# Patient Record
Sex: Female | Born: 1935 | Race: White | Hispanic: No | Smoking: Never smoker
Health system: Southern US, Community
[De-identification: ages and names within clinical notes are randomized; demographics above are authoritative.]

## PROBLEM LIST (undated history)

## (undated) DIAGNOSIS — R011 Cardiac murmur, unspecified: Secondary | ICD-10-CM

## (undated) DIAGNOSIS — I10 Essential (primary) hypertension: Secondary | ICD-10-CM

## (undated) DIAGNOSIS — H409 Unspecified glaucoma: Secondary | ICD-10-CM

## (undated) DIAGNOSIS — I4891 Unspecified atrial fibrillation: Secondary | ICD-10-CM

## (undated) DIAGNOSIS — C801 Malignant (primary) neoplasm, unspecified: Secondary | ICD-10-CM

## (undated) DIAGNOSIS — M1990 Unspecified osteoarthritis, unspecified site: Secondary | ICD-10-CM

## (undated) DIAGNOSIS — T7840XA Allergy, unspecified, initial encounter: Secondary | ICD-10-CM

## (undated) DIAGNOSIS — E785 Hyperlipidemia, unspecified: Secondary | ICD-10-CM

## (undated) DIAGNOSIS — G43909 Migraine, unspecified, not intractable, without status migrainosus: Secondary | ICD-10-CM

## (undated) HISTORY — PX: BREAST EXCISIONAL BIOPSY: SUR124

## (undated) HISTORY — DX: Unspecified atrial fibrillation: I48.91

## (undated) HISTORY — DX: Migraine, unspecified, not intractable, without status migrainosus: G43.909

## (undated) HISTORY — DX: Essential (primary) hypertension: I10

## (undated) HISTORY — DX: Malignant (primary) neoplasm, unspecified: C80.1

## (undated) HISTORY — PX: ROBOTIC ASSISTED LAP VAGINAL HYSTERECTOMY: SHX2362

## (undated) HISTORY — DX: Unspecified glaucoma: H40.9

## (undated) HISTORY — DX: Allergy, unspecified, initial encounter: T78.40XA

## (undated) HISTORY — PX: ABDOMINAL HYSTERECTOMY: SHX81

## (undated) HISTORY — DX: Unspecified osteoarthritis, unspecified site: M19.90

## (undated) HISTORY — DX: Cardiac murmur, unspecified: R01.1

## (undated) HISTORY — PX: TONSILLECTOMY AND ADENOIDECTOMY: SHX28

## (undated) HISTORY — DX: Hyperlipidemia, unspecified: E78.5

---

## 1968-05-21 HISTORY — PX: BREAST LUMPECTOMY: SHX2

## 2001-04-11 DIAGNOSIS — I10 Essential (primary) hypertension: Secondary | ICD-10-CM | POA: Insufficient documentation

## 2001-04-11 DIAGNOSIS — I480 Paroxysmal atrial fibrillation: Secondary | ICD-10-CM | POA: Insufficient documentation

## 2010-05-21 DIAGNOSIS — C55 Malignant neoplasm of uterus, part unspecified: Secondary | ICD-10-CM

## 2010-05-21 HISTORY — DX: Malignant neoplasm of uterus, part unspecified: C55

## 2010-09-20 DIAGNOSIS — N8502 Endometrial intraepithelial neoplasia [EIN]: Secondary | ICD-10-CM | POA: Insufficient documentation

## 2010-09-30 DIAGNOSIS — C541 Malignant neoplasm of endometrium: Secondary | ICD-10-CM | POA: Insufficient documentation

## 2012-11-19 DIAGNOSIS — E785 Hyperlipidemia, unspecified: Secondary | ICD-10-CM | POA: Insufficient documentation

## 2012-11-19 DIAGNOSIS — G43909 Migraine, unspecified, not intractable, without status migrainosus: Secondary | ICD-10-CM

## 2012-11-19 HISTORY — DX: Migraine, unspecified, not intractable, without status migrainosus: G43.909

## 2016-02-14 DIAGNOSIS — G8929 Other chronic pain: Secondary | ICD-10-CM | POA: Insufficient documentation

## 2021-05-04 ENCOUNTER — Ambulatory Visit (INDEPENDENT_AMBULATORY_CARE_PROVIDER_SITE_OTHER): Payer: Medicare PPO | Admitting: Adult Health

## 2021-05-04 ENCOUNTER — Encounter: Payer: Self-pay | Admitting: Adult Health

## 2021-05-04 ENCOUNTER — Other Ambulatory Visit: Payer: Self-pay

## 2021-05-04 VITALS — BP 138/66 | HR 61 | Wt 131.0 lb

## 2021-05-04 DIAGNOSIS — G8929 Other chronic pain: Secondary | ICD-10-CM

## 2021-05-04 DIAGNOSIS — M545 Low back pain, unspecified: Secondary | ICD-10-CM

## 2021-05-04 DIAGNOSIS — Z8542 Personal history of malignant neoplasm of other parts of uterus: Secondary | ICD-10-CM

## 2021-05-04 DIAGNOSIS — Z8669 Personal history of other diseases of the nervous system and sense organs: Secondary | ICD-10-CM

## 2021-05-04 DIAGNOSIS — M1990 Unspecified osteoarthritis, unspecified site: Secondary | ICD-10-CM

## 2021-05-04 DIAGNOSIS — F5101 Primary insomnia: Secondary | ICD-10-CM | POA: Diagnosis not present

## 2021-05-04 DIAGNOSIS — I10 Essential (primary) hypertension: Secondary | ICD-10-CM

## 2021-05-04 DIAGNOSIS — E559 Vitamin D deficiency, unspecified: Secondary | ICD-10-CM

## 2021-05-04 DIAGNOSIS — I480 Paroxysmal atrial fibrillation: Secondary | ICD-10-CM | POA: Diagnosis not present

## 2021-05-04 DIAGNOSIS — M25512 Pain in left shoulder: Secondary | ICD-10-CM

## 2021-05-04 DIAGNOSIS — D509 Iron deficiency anemia, unspecified: Secondary | ICD-10-CM

## 2021-05-04 NOTE — Patient Instructions (Signed)
Anemia Anemia is a condition in which there is not enough red blood cells or hemoglobin in the blood. Hemoglobin is a substance in red blood cells that carries oxygen. When you do not have enough red blood cells or hemoglobin (are anemic), your body cannot get enough oxygen and your organs may not work properly. As a result, you may feel very tired or have other problems. What are the causes? Common causes of anemia include: Excessive bleeding. Anemia can be caused by excessive bleeding inside or outside the body, including bleeding from the intestines or from heavy menstrual periods in females. Poor nutrition. Long-lasting (chronic) kidney, thyroid, and liver disease. Bone marrow disorders, spleen problems, and blood disorders. Cancer and treatments for cancer. HIV (human immunodeficiency virus) and AIDS (acquired immunodeficiency syndrome). Infections, medicines, and autoimmune disorders that destroy red blood cells. What are the signs or symptoms? Symptoms of this condition include: Minor weakness. Dizziness. Headache, or difficulties concentrating and sleeping. Heartbeats that feel irregular or faster than normal (palpitations). Shortness of breath, especially with exercise. Pale skin, lips, and nails, or cold hands and feet. Indigestion and nausea. Symptoms may occur suddenly or develop slowly. If your anemia is mild, you may not have symptoms. How is this diagnosed? This condition is diagnosed based on blood tests, your medical history, and a physical exam. In some cases, a test may be needed in which cells are removed from the soft tissue inside of a bone and looked at under a microscope (bone marrow biopsy). Your health care provider may also check your stool (feces) for blood and may do additional testing to look for the cause of your bleeding. Other tests may include: Imaging tests, such as a CT scan or MRI. A procedure to see inside your esophagus and stomach (endoscopy). A  procedure to see inside your colon and rectum (colonoscopy). How is this treated? Treatment for this condition depends on the cause. If you continue to lose a lot of blood, you may need to be treated at a hospital. Treatment may include: Taking supplements of iron, vitamin U54, or folic acid. Taking a hormone medicine (erythropoietin) that can help to stimulate red blood cell growth. Having a blood transfusion. This may be needed if you lose a lot of blood. Making changes to your diet. Having surgery to remove your spleen. Follow these instructions at home: Take over-the-counter and prescription medicines only as told by your health care provider. Take supplements only as told by your health care provider. Follow any diet instructions that you were given by your health care provider. Keep all follow-up visits as told by your health care provider. This is important. Contact a health care provider if: You develop new bleeding anywhere in the body. Get help right away if: You are very weak. You are short of breath. You have pain in your abdomen or chest. You are dizzy or feel faint. You have trouble concentrating. You have bloody stools, black stools, or tarry stools. You vomit repeatedly or you vomit up blood. These symptoms may represent a serious problem that is an emergency. Do not wait to see if the symptoms will go away. Get medical help right away. Call your local emergency services (911 in the [place] Do not drive yourself to the hospital. Summary Anemia is a condition in which you do not have enough red blood cells or enough of a substance in your red blood cells that carries oxygen (hemoglobin). Symptoms may occur suddenly or develop slowly. If your anemia is  mild, you may not have symptoms. °This condition is diagnosed with blood tests, a medical history, and a physical exam. Other tests may be needed. °Treatment for this condition depends on the cause of the anemia. °This  information is not intended to replace advice given to you by your health care provider. Make sure you discuss any questions you have with your health care provider. °Document Revised: 04/14/2019 Document Reviewed: 04/14/2019 °Elsevier Patient Education © 2022 Elsevier Inc. °Health Maintenance, Female °Adopting a healthy lifestyle and getting preventive care are important in promoting health and wellness. Ask your health care provider about: °The right schedule for you to have regular tests and exams. °Things you can do on your own to prevent diseases and keep yourself healthy. °What should I know about diet, weight, and exercise? °Eat a healthy diet ° °Eat a diet that includes plenty of vegetables, fruits, low-fat dairy products, and lean protein. °Do not eat a lot of foods that are high in solid fats, added sugars, or sodium. °Maintain a healthy weight °Body mass index (BMI) is used to identify weight problems. It estimates body fat based on height and weight. Your health care provider can help determine your BMI and help you achieve or maintain a healthy weight. °Get regular exercise °Get regular exercise. This is one of the most important things you can do for your health. Most adults should: °Exercise for at least 150 minutes each week. The exercise should increase your heart rate and make you sweat (moderate-intensity exercise). °Do strengthening exercises at least twice a week. This is in addition to the moderate-intensity exercise. °Spend less time sitting. Even light physical activity can be beneficial. °Watch cholesterol and blood lipids °Have your blood tested for lipids and cholesterol at 85 years of age, then have this test every 5 years. °Have your cholesterol levels checked more often if: °Your lipid or cholesterol levels are high. °You are older than 85 years of age. °You are at high risk for heart disease. °What should I know about cancer screening? °Depending on your health history and family  history, you may need to have cancer screening at various ages. This may include screening for: °Breast cancer. °Cervical cancer. °Colorectal cancer. °Skin cancer. °Lung cancer. °What should I know about heart disease, diabetes, and high blood pressure? °Blood pressure and heart disease °High blood pressure causes heart disease and increases the risk of stroke. This is more likely to develop in people who have high blood pressure readings or are overweight. °Have your blood pressure checked: °Every 3-5 years if you are 18-39 years of age. °Every year if you are 40 years old or older. °Diabetes °Have regular diabetes screenings. This checks your fasting blood sugar level. Have the screening done: °Once every three years after age 40 if you are at a normal weight and have a low risk for diabetes. °More often and at a younger age if you are overweight or have a high risk for diabetes. °What should I know about preventing infection? °Hepatitis B °If you have a higher risk for hepatitis B, you should be screened for this virus. Talk with your health care provider to find out if you are at risk for hepatitis B infection. °Hepatitis C °Testing is recommended for: °Everyone born from 1945 through 1965. °Anyone with known risk factors for hepatitis C. °Sexually transmitted infections (STIs) °Get screened for STIs, including gonorrhea and chlamydia, if: °You are sexually active and are younger than 85 years of age. °You are older   than 85 years of age and your health care provider tells you that you are at risk for this type of infection. Your sexual activity has changed since you were last screened, and you are at increased risk for chlamydia or gonorrhea. Ask your health care provider if you are at risk. Ask your health care provider about whether you are at high risk for HIV. Your health care provider may recommend a prescription medicine to help prevent HIV infection. If you choose to take medicine to prevent HIV, you  should first get tested for HIV. You should then be tested every 3 months for as long as you are taking the medicine. Pregnancy If you are about to stop having your period (premenopausal) and you may become pregnant, seek counseling before you get pregnant. Take 400 to 800 micrograms (mcg) of folic acid every day if you become pregnant. Ask for birth control (contraception) if you want to prevent pregnancy. Osteoporosis and menopause Osteoporosis is a disease in which the bones lose minerals and strength with aging. This can result in bone fractures. If you are 75 years old or older, or if you are at risk for osteoporosis and fractures, ask your health care provider if you should: Be screened for bone loss. Take a calcium or vitamin D supplement to lower your risk of fractures. Be given hormone replacement therapy (HRT) to treat symptoms of menopause. Follow these instructions at home: Alcohol use Do not drink alcohol if: Your health care provider tells you not to drink. You are pregnant, may be pregnant, or are planning to become pregnant. If you drink alcohol: Limit how much you have to: 0-1 drink a day. Know how much alcohol is in your drink. In the [place] one drink equals one 12 oz bottle of beer (355 mL), one 5 oz glass of wine (148 mL), or one 1 oz glass of hard liquor (44 mL). Lifestyle Do not use any products that contain nicotine or tobacco. These products include cigarettes, chewing tobacco, and vaping devices, such as e-cigarettes. If you need help quitting, ask your health care provider. Do not use street drugs. Do not share needles. Ask your health care provider for help if you need support or information about quitting drugs. General instructions Schedule regular health, dental, and eye exams. Stay current with your vaccines. Tell your health care provider if: You often feel depressed. You have ever been abused or do not feel safe at home. Summary Adopting a healthy  lifestyle and getting preventive care are important in promoting health and wellness. Follow your health care provider's instructions about healthy diet, exercising, and getting tested or screened for diseases. Follow your health care provider's instructions on monitoring your cholesterol and blood pressure. This information is not intended to replace advice given to you by your health care provider. Make sure you discuss any questions you have with your health care provider. Document Revised: 09/26/2020 Document Reviewed: 09/26/2020 Elsevier Patient Education  [place].

## 2021-05-04 NOTE — Progress Notes (Signed)
New Patient Office Visit  Subjective:  Patient ID: Janice Bush, female    DOB: 07/03/1931  Age: 87 y.o. MRN: [id number]  CC:  Chief Complaint  Patient presents with   New Patient (Initial Visit)    HPI Janice Bush presents for new patient care, she has been seeing [facility] Dr. Redmond Pulling, for healthcare but just moved to village of [place] from [facility].   She reports she is feeling fine today.  She was diagnosed with anemia.  She was started on Iron 03/25/21.  History of glaucoma   Seen before at spine center at [facility] in past for chronic scoliosis and sciatica.   Patient  denies any fever, body aches,chills, rash, chest pain, shortness of breath, nausea, vomiting, or diarrhea.  Denies dizziness, lightheadedness, pre syncopal or syncopal episodes.     Past Medical History:  Diagnosis Date   Afib ([place])    Allergy    Arthritis    Cancer ([place])    Glaucoma    Heart murmur    Hyperlipidemia    Hypertension    Migraines     Past Surgical History:  Procedure Laterality Date   ABDOMINAL HYSTERECTOMY     TONSILLECTOMY AND ADENOIDECTOMY      Family History  Problem Relation Age of Onset   Hypertension Mother    Early death Mother    Cancer Mother    Early death Father    Alcohol abuse Father    Heart attack Father    Hypertension Brother    Hyperlipidemia Brother    Heart attack Brother    Early death Brother    Diabetes Brother    Heart attack Brother    Early death Brother     Social History   Socioeconomic History   Marital status: Widowed    Spouse name: Not on file   Number of children: Not on file   Years of education: Not on file   Highest education level: Not on file  Occupational History   Not on file  Tobacco Use   Smoking status: Never   Smokeless tobacco: Never  Vaping Use   Vaping Use: Never used  Substance and Sexual Activity   Alcohol use: Never   Drug use: Never   Sexual activity: Not Currently  Other Topics Concern   Not  on file  Social History Narrative   Not on file   Social Determinants of Health   Financial Resource Strain: Not on file  Food Insecurity: Not on file  Transportation Needs: Not on file  Physical Activity: Not on file  Stress: Not on file  Social Connections: Not on file  Intimate Partner Violence: Not on file    ROS Review of Systems  Objective:   Today's Vitals: BP 138/66    Pulse 61    Wt 131 lb (59.4 kg)    SpO2 95%   Physical Exam Vitals reviewed.  Constitutional:      General: She is not in acute distress.    Appearance: Normal appearance. She is well-developed. She is not ill-appearing, toxic-appearing or diaphoretic.     Interventions: She is not intubated. HENT:     Head: Normocephalic and atraumatic.     Right Ear: External ear normal. There is no impacted cerumen.     Left Ear: External ear normal. There is no impacted cerumen.     Nose: Nose normal.     Mouth/Throat:     Pharynx: No oropharyngeal exudate.  Eyes:  General: Lids are normal. No scleral icterus.       Right eye: No discharge.        Left eye: No discharge.     Extraocular Movements: Extraocular movements intact.     Conjunctiva/sclera: Conjunctivae normal.     Right eye: Right conjunctiva is not injected. No exudate or hemorrhage.    Left eye: Left conjunctiva is not injected. No exudate or hemorrhage.    Pupils: Pupils are equal, round, and reactive to light.  Neck:     Thyroid: No thyroid mass or thyromegaly.     Vascular: Normal carotid pulses. No carotid bruit, hepatojugular reflux or JVD.     Trachea: Trachea and phonation normal. No tracheal tenderness or tracheal deviation.     Meningeal: Brudzinski's sign and Kernig's sign absent.  Cardiovascular:     Rate and Rhythm: Normal rate and regular rhythm.     Pulses: Normal pulses.          Radial pulses are 2+ on the right side and 2+ on the left side.       Dorsalis pedis pulses are 2+ on the right side and 2+ on the left side.        Posterior tibial pulses are 2+ on the right side and 2+ on the left side.     Heart sounds: Normal heart sounds, S1 normal and S2 normal. Heart sounds not distant. No murmur heard.   No friction rub. No gallop.  Pulmonary:     Effort: Pulmonary effort is normal. No tachypnea, bradypnea, accessory muscle usage or respiratory distress. She is not intubated.     Breath sounds: Normal breath sounds. No stridor. No wheezing or rales.  Chest:     Chest wall: No tenderness.  Abdominal:     General: Bowel sounds are normal. There is no distension or abdominal bruit.     Palpations: Abdomen is soft. There is no shifting dullness, fluid wave, hepatomegaly, splenomegaly, mass or pulsatile mass.     Tenderness: There is no abdominal tenderness. There is no guarding or rebound.     Hernia: No hernia is present.  Musculoskeletal:        General: No tenderness or deformity. Normal range of motion.     Cervical back: Full passive range of motion without pain, normal range of motion and neck supple. No edema, erythema or rigidity. No spinous process tenderness or muscular tenderness. Normal range of motion.     Right lower leg: No edema.     Left lower leg: No edema.  Lymphadenopathy:     Head:     Right side of head: No submental, submandibular, tonsillar, preauricular, posterior auricular or occipital adenopathy.     Left side of head: No submental, submandibular, tonsillar, preauricular, posterior auricular or occipital adenopathy.     Cervical: No cervical adenopathy.     Right cervical: No superficial, deep or posterior cervical adenopathy.    Left cervical: No superficial, deep or posterior cervical adenopathy.     Upper Body:     Right upper body: No supraclavicular or pectoral adenopathy.     Left upper body: No supraclavicular or pectoral adenopathy.  Skin:    General: Skin is warm and dry.     Coloration: Skin is not pale.     Findings: No abrasion, bruising, burn, ecchymosis, erythema,  lesion, petechiae or rash.     Nails: There is no clubbing.  Neurological:     General: No focal deficit present.  Mental Status: She is alert and oriented to person, place, and time.     GCS: GCS eye subscore is 4. GCS verbal subscore is 5. GCS motor subscore is 6.     Cranial Nerves: No cranial nerve deficit.     Sensory: No sensory deficit.     Motor: No weakness, tremor, atrophy, abnormal muscle tone or seizure activity.     Coordination: Coordination normal.     Gait: Gait normal.     Deep Tendon Reflexes: Reflexes are normal and symmetric. Reflexes normal. Babinski sign absent on the right side. Babinski sign absent on the left side.     Reflex Scores:      Tricep reflexes are 2+ on the right side and 2+ on the left side.      Bicep reflexes are 2+ on the right side and 2+ on the left side.      Brachioradialis reflexes are 2+ on the right side and 2+ on the left side.      Patellar reflexes are 2+ on the right side and 2+ on the left side.      Achilles reflexes are 2+ on the right side and 2+ on the left side. Psychiatric:        Mood and Affect: Mood normal.        Speech: Speech normal.        Behavior: Behavior normal.        Thought Content: Thought content normal.        Judgment: Judgment normal.    Assessment & Plan:   Problem List Items Addressed This Visit       Cardiovascular and Mediastinum   Essential hypertension   Relevant Medications   apixaban (ELIQUIS) 5 MG TABS tablet   amLODipine (NORVASC) 5 MG tablet   losartan (COZAAR) 50 MG tablet   digoxin (LANOXIN) 0.25 MG tablet   Other Relevant Orders   Ambulatory referral to Cardiology   Lipid panel   TSH   AMB Referral to [facility]   Paroxysmal atrial fibrillation (HCC) - Primary   Relevant Medications   apixaban (ELIQUIS) 5 MG TABS tablet   amLODipine (NORVASC) 5 MG tablet   losartan (COZAAR) 50 MG tablet   digoxin (LANOXIN) 0.25 MG tablet   Other Relevant Orders   Ambulatory  referral to Cardiology   AMB Referral to [place]     Other   Chronic lumbosacral pain   Relevant Medications   gabapentin (NEURONTIN) 300 MG capsule   gabapentin (NEURONTIN) 100 MG capsule   Other Relevant Orders   AMB Referral to [place]   Other Visit Diagnoses     Osteoarthritis, unspecified osteoarthritis type, unspecified site       Relevant Orders   AMB Referral to [facility]ordin[facility]   Primary insomnia       Relevant Orders   AMB Referral to [facility] Coordin[facility]   History of endometrial cancer       Relevant Orders   AMB Referral to [facility]ordin[facility]   Chronic left shoulder pain       Relevant Medications   gabapentin (NEURONTIN) 300 MG capsule   gabapentin (NEURONTIN) 100 MG capsule   Other Relevant Orders   AMB Referral to [facility] Coordin[facility]   History of migraine       Relevant Orders   AMB Referral to [facility] Coordin[facility]   Iron deficiency anemia, unspecified iron deficiency anemia type  Relevant Orders   CBC with Differential/Platelet   Comprehensive metabolic panel   Iron, TIBC and Ferritin Panel   B12   Aerobic culture   AMB Referral to [place]   Vitamin D deficiency       Relevant Orders   VITAMIN D 25 Hydroxy (Vit-D Deficiency, Fractures)   AMB Referral to [facility]       Outpatient Encounter Medications as of 05/04/2021  Medication Sig   amLODipine (NORVASC) 5 MG tablet Take 1 tablet by mouth daily.   apixaban (ELIQUIS) 5 MG TABS tablet Take 1 tablet by mouth 2 (two) times daily.   digoxin (LANOXIN) 0.25 MG tablet Take by mouth.   estradiol (ESTRACE) 0.1 MG/GM vaginal cream Place vaginally.   gabapentin (NEURONTIN) 100 MG capsule Take by mouth.   gabapentin (NEURONTIN) 300 MG capsule Take by mouth.   LORazepam (ATIVAN) 0.5 MG tablet Take by mouth.   timolol (TIMOPTIC-XR) 0.5 % ophthalmic gel-forming Administer 1 drop to both eyes  Take as directed.   losartan (COZAAR) 50 MG tablet Take by mouth.   No facility-administered encounter medications on file as of 05/04/2021.   Orders Placed This Encounter  Procedures   Aerobic culture    Standing Status:   Future    Standing Expiration Date:   05/04/2022    Order Specific Question:   Source    Answer:   rectal   CBC with Differential/Platelet    Standing Status:   Future    Standing Expiration Date:   05/04/2022   Comprehensive metabolic panel    Standing Status:   Future    Standing Expiration Date:   05/04/2022   VITAMIN D 25 Hydroxy (Vit-D Deficiency, Fractures)    Standing Status:   Future    Standing Expiration Date:   05/04/2022   Lipid panel    Standing Status:   Future    Standing Expiration Date:   05/04/2022   TSH    Standing Status:   Future    Standing Expiration Date:   05/04/2022   Iron, TIBC and Ferritin Panel    Standing Status:   Future    Standing Expiration Date:   05/04/2022   B12    Standing Status:   Future    Standing Expiration Date:   05/04/2022   Ambulatory referral to Cardiology    Referral Priority:   Routine    Referral Type:   Consultation    Referral Reason:   Specialty Services Required    Requested Specialty:   Cardiology    Number of Visits Requested:   1   AMB Referral to [facility] Coordin[facility]    Referral Priority:   Routine    Referral Type:   Consultation    Referral Reason:   Care Coordination    Number of Visits Requested:   1   Fasting labs followed by CPE advised. Follow up in 3 months on chronic disease. Referral to cardiology for chronic Atrial fibrillation. Should hear within 3 weeks and if not give our office a call is advised.  Return in about 3 months (around 08/02/2021), or if symptoms worsen or fail to improve, for at any time for any worsening symptoms, Go to Emergency room/ urgent care if worse.   Follow-up: Return in about 3 months (around 08/02/2021), or if symptoms worsen or fail to improve, for  at any time for any worsening symptoms, Go to Emergency room/ urgent care if worse.   [place]  Harold Mattes, FNP

## 2021-05-05 ENCOUNTER — Encounter: Payer: Self-pay | Admitting: Adult Health

## 2021-05-07 ENCOUNTER — Encounter: Payer: Self-pay | Admitting: Adult Health

## 2021-05-08 ENCOUNTER — Telehealth: Payer: Self-pay

## 2021-05-08 NOTE — Telephone Encounter (Signed)
Noted. She was in agreement in office. Thank you

## 2021-05-08 NOTE — Chronic Care Management (AMB) (Signed)
Chronic Care Management   Note  05/08/2021 Name: Kylah Maresh MRN: [id number] DOB: 12-Jun-1931  Annaliza Zia is a 87 y.o. year old female who is a primary care patient of Flinchum, Kelby Aline, FNP. I reached out to Allena Earing by phone today in response to a referral sent by Ms. Pamala Hurry Donnan's PCP.  Ms. Dolinger was given information about Chronic Care Management services today including:  CCM service includes personalized support from designated clinical staff supervised by her physician, including individualized plan of care and coordination with other care providers 24/7 contact phone numbers for assistance for urgent and routine care needs. Service will only be billed when office clinical staff spend 20 minutes or more in a month to coordinate care. Only one practitioner may furnish and bill the service in a calendar month. The patient may stop CCM services at any time (effective at the end of the month) by phone call to the office staff. The patient is responsible for co-pay (up to 20% after annual deductible is met) if co-pay is required by the individual health plan.   Patient did not agree to enrollment in care management services and does not wish to consider at this time.  Follow up plan: Patient declines engagement by the care management team. Appropriate care team members and provider have been notified via electronic communication.   Noreene Larsson, [place], [place], [place] [address] Direct Dial: [phone] [email or web address]: [email or web address]

## 2021-05-30 ENCOUNTER — Encounter: Payer: Self-pay | Admitting: Cardiovascular Disease

## 2021-05-30 ENCOUNTER — Other Ambulatory Visit: Payer: Self-pay

## 2021-05-30 ENCOUNTER — Ambulatory Visit: Payer: Medicare PPO | Admitting: Cardiovascular Disease

## 2021-05-30 VITALS — BP 118/78 | HR 54 | Ht 65.0 in | Wt 130.1 lb

## 2021-05-30 DIAGNOSIS — E782 Mixed hyperlipidemia: Secondary | ICD-10-CM

## 2021-05-30 DIAGNOSIS — I480 Paroxysmal atrial fibrillation: Secondary | ICD-10-CM | POA: Diagnosis not present

## 2021-05-30 DIAGNOSIS — I10 Essential (primary) hypertension: Secondary | ICD-10-CM | POA: Diagnosis not present

## 2021-05-30 MED ORDER — DIGOXIN 250 MCG PO TABS: 0.1250 mg | ORAL_TABLET | Freq: Every day | ORAL | 3 refills | Status: DC

## 2021-05-30 MED ORDER — APIXABAN 2.5 MG PO TABS: 2.5000 mg | ORAL_TABLET | Freq: Two times a day (BID) | ORAL | 11 refills | Status: DC

## 2021-05-30 NOTE — Progress Notes (Signed)
Cardiology Office Note  Date:  05/30/2021   ID:  Janice Bush, DOB 01-20-1932, MRN [id number]  PCP:  Doreen Beam, FNP   Chief Complaint  Patient presents with   New Patient (Initial Visit)    Ref by Laverna Peace, FNP to establish care for A-Fib. Medications reviewed by the patient verbally.     HPI:  Janice Bush is a 88 year old woman with past medical history of Anemia, on iron Scoliosis/sciatica Atrial fibrillation, likely permanent Hypertension, hyperlipidemia Who presents by referral from Laverna Peace for consultation of her atrial fibrillation  Notes from [facility] geriatric office reviewed On Eliquis reduced dose, digoxin  previously on metoprolol  EKG at [facility] March 30, 2021 showing atrial fibrillation rate 82 bpm left bundle branch block  Echocardiogram 2017 ejection fraction greater than 55% normal left atrial size  Digoxin level 1.1 in November 2022 Total cholesterol 209 LDL 122  Ill last week, went to urgent care "[facility]"  Prior PMD stopped metoprolol, unclear reasons She feels it was because she was on too many medications   EKG personally reviewed by myself on todays visit Atrial fib rate 54 bpm   PMH:   has a past medical history of Afib ([place]), Allergy, Arthritis, Cancer ([place]), Glaucoma, Heart murmur, Hyperlipidemia, Hypertension, and Migraines.  PSH:    Past Surgical History:  Procedure Laterality Date   ABDOMINAL HYSTERECTOMY     TONSILLECTOMY AND ADENOIDECTOMY      Current Outpatient Medications  Medication Sig Dispense Refill   amLODipine (NORVASC) 5 MG tablet Take 1 tablet by mouth daily.     ascorbic acid (VITAMIN C) 500 MG tablet Take by mouth.     Cholecalciferol (VITAMIN D3 PO) Take by mouth.     Cyanocobalamin (VITAMIN B 12) 100 MCG LOZG Take by mouth daily.     digoxin (LANOXIN) 0.25 MG tablet Take by mouth.     estradiol (ESTRACE) 0.1 MG/GM vaginal cream Place vaginally.     ferrous sulfate 324 MG TBEC  Take 324 mg by mouth.     gabapentin (NEURONTIN) 100 MG capsule Take by mouth.     gabapentin (NEURONTIN) 300 MG capsule Take by mouth.     hydrocortisone 2.5 % ointment Apply 1 application TWICE DAILY.     LORazepam (ATIVAN) 0.5 MG tablet Take by mouth.     timolol (TIMOPTIC-XR) 0.5 % ophthalmic gel-forming Administer 1 drop to both eyes Take as directed.     apixaban (ELIQUIS) 2.5 MG TABS tablet Take 1 tablet (2.5 mg total) by mouth 2 (two) times daily.     losartan (COZAAR) 25 MG tablet Take 1 tablet (25 mg total) by mouth in the morning and at bedtime.     No current facility-administered medications for this visit.     Allergies:   Tramadol and Diphenhydramine hcl   Social History:  The patient  reports that she has never smoked. She has never used smokeless tobacco. She reports that she does not drink alcohol and does not use drugs.   Family History:   family history includes Alcohol abuse in her father; Cancer in her mother; Diabetes in her brother; Early death in her brother, brother, father, and mother; Heart attack in her brother, brother, and father; Hyperlipidemia in her brother; Hypertension in her brother and mother.    Review of Systems: Review of Systems  Constitutional: Negative.   HENT: Negative.    Respiratory: Negative.    Cardiovascular: Negative.   Gastrointestinal: Negative.   Musculoskeletal: Negative.  Neurological: Negative.   Psychiatric/Behavioral: Negative.    All other systems reviewed and are negative.   PHYSICAL EXAM: VS:  BP 118/78 (BP Location: Right Arm, Patient Position: Sitting, Cuff Size: Normal)    Pulse (!) 54    Ht 5\' 5"  (1.651 m)    Wt 130 lb 2 oz (59 kg)    SpO2 98%    BMI 21.65 kg/m  , BMI Body mass index is 21.65 kg/m. GEN: Well nourished, well developed, in no acute distress HEENT: normal Neck: no JVD, carotid bruits, or masses Cardiac: RRR; no murmurs, rubs, or gallops,no edema  Respiratory:  clear to auscultation bilaterally,  normal work of breathing GI: soft, nontender, nondistended, + BS MS: no deformity or atrophy Skin: warm and dry, no rash Neuro:  Strength and sensation are intact Psych: euthymic mood, full affect  Recent Labs: No results found for requested labs within last 8760 hours.    Lipid Panel No results found for: CHOL, HDL, LDLCALC, TRIG    Wt Readings from Last 3 Encounters:  05/30/21 130 lb 2 oz (59 kg)  05/04/21 131 lb (59.4 kg)       ASSESSMENT AND PLAN:  Problem List Items Addressed This Visit       Cardiology Problems   Hyperlipidemia   Relevant Medications   losartan (COZAAR) 25 MG tablet   apixaban (ELIQUIS) 2.5 MG TABS tablet   Essential hypertension   Relevant Medications   losartan (COZAAR) 25 MG tablet   apixaban (ELIQUIS) 2.5 MG TABS tablet   Other Relevant Orders   EKG 12-Lead   Paroxysmal atrial fibrillation (HCC) - Primary   Relevant Medications   losartan (COZAAR) 25 MG tablet   apixaban (ELIQUIS) 2.5 MG TABS tablet   Other Relevant Orders   EKG 12-Lead   Permanent atrial fibrillation On Eliquis, Metoprolol previously held by outpatient primary care at [facility] she decrease digoxin in half, down to 0.125 mg daily given bradycardia Compliance with Eliquis recommended to prevent TIA and stroke symptoms  PVCs Noted on EKG, asymptomatic Monitor for now  Essential hypertension Blood pressure is well controlled on today's visit. No changes made to the medications.    Total encounter time more than 60 minutes  Greater than 50% was spent in counseling and coordination of care with the patient    Signed, Esmond Plants, M.D., Ph.D. [place], [place]

## 2021-05-30 NOTE — Patient Instructions (Addendum)
Medication Instructions:  Please REDUCE Digoxin in 1/2 daily (0.125 daily) Eliquis 2.5 mg twice a day    If you need a refill on your cardiac medications before your next appointment, please call your pharmacy.   Lab work: No new labs needed  Testing/Procedures: No new testing needed  Follow-Up: At [facility], you and your health needs are our priority.  As part of our continuing mission to provide you with exceptional heart care, we have created designated Provider Care Teams.  These Care Teams include your primary Cardiologist (physician) and Advanced Practice Providers (APPs -  Physician Assistants and Nurse Practitioners) who all work together to provide you with the care you need, when you need it.  You will need a follow up appointment in 6 months, APP ok  Providers on your designated Care Team:   Murray Hodgkins, NP Christell Faith, PA-C Cadence Kathlen Mody, [place]  COVID-19 Vaccine Information can be found at: ShippingScam.co.uk For questions related to vaccine distribution or appointments, please email [email or web address] .[email or web address] or call [phone].

## 2021-05-31 ENCOUNTER — Other Ambulatory Visit: Payer: Self-pay | Admitting: Adult Health

## 2021-06-23 ENCOUNTER — Telehealth: Payer: Self-pay | Admitting: Adult Health

## 2021-06-23 NOTE — Telephone Encounter (Signed)
Pt called in stating that she have a lab appt on 08/01/2021 at 9:15am. Pt was wondering if it would be ok for her to do lab orders at the [facility] and have the lab result sent over to our office. Pt stated that she thinks that would be easier. Pt was wondering if she should just come to the office to get them dobe were the lab result will be back by the time of Pt physical appt.  Pt requesting callback

## 2021-06-28 ENCOUNTER — Other Ambulatory Visit: Payer: Self-pay

## 2021-06-28 ENCOUNTER — Encounter: Payer: Self-pay | Admitting: Adult Health

## 2021-07-10 ENCOUNTER — Other Ambulatory Visit: Payer: Self-pay

## 2021-07-10 MED ORDER — DIGOXIN 125 MCG PO TABS: 0.1250 mg | ORAL_TABLET | Freq: Every day | ORAL | 1 refills | Status: DC

## 2021-07-11 ENCOUNTER — Other Ambulatory Visit: Payer: Self-pay

## 2021-08-01 ENCOUNTER — Other Ambulatory Visit: Payer: Medicare PPO

## 2021-08-03 ENCOUNTER — Ambulatory Visit (INDEPENDENT_AMBULATORY_CARE_PROVIDER_SITE_OTHER): Payer: Medicare PPO | Admitting: Adult Health

## 2021-08-03 ENCOUNTER — Other Ambulatory Visit: Payer: Self-pay

## 2021-08-03 ENCOUNTER — Encounter: Payer: Self-pay | Admitting: Adult Health

## 2021-08-03 ENCOUNTER — Other Ambulatory Visit (HOSPITAL_COMMUNITY)
Admission: RE | Admit: 2021-08-03 | Discharge: 2021-08-03 | Disposition: A | Discharge status: Home / Self Care | Payer: Medicare PPO | Source: Ambulatory Visit | Attending: Adult Health | Admitting: Adult Health

## 2021-08-03 VITALS — BP 136/60 | HR 85 | Temp 98.0°F | Ht 65.0 in | Wt 131.0 lb

## 2021-08-03 DIAGNOSIS — Z1231 Encounter for screening mammogram for malignant neoplasm of breast: Secondary | ICD-10-CM

## 2021-08-03 DIAGNOSIS — C541 Malignant neoplasm of endometrium: Secondary | ICD-10-CM

## 2021-08-03 DIAGNOSIS — N898 Other specified noninflammatory disorders of vagina: Secondary | ICD-10-CM | POA: Diagnosis present

## 2021-08-03 DIAGNOSIS — I480 Paroxysmal atrial fibrillation: Secondary | ICD-10-CM

## 2021-08-03 DIAGNOSIS — Z0000 Encounter for general adult medical examination without abnormal findings: Secondary | ICD-10-CM | POA: Insufficient documentation

## 2021-08-03 DIAGNOSIS — Z90710 Acquired absence of both cervix and uterus: Secondary | ICD-10-CM | POA: Diagnosis not present

## 2021-08-03 MED ORDER — NYSTATIN 100000 UNIT/GM EX CREA: 1.0000 "application " | TOPICAL_CREAM | Freq: Two times a day (BID) | CUTANEOUS | 0 refills | Status: DC

## 2021-08-03 NOTE — Patient Instructions (Addendum)
Therapist, music at [facility] ?Address: [address] [place], [place] [address] ?Phone: [phone] ?Eugenia Pancoast FNP ? ? ?Call to schedule your screening mammogram. Your orders have been placed for your exam.  ?Let our office know if you have questions, concerns, or any difficulty scheduling.  ?If normal results then yearly screening mammograms are recommended unless you notice  ?Changes in your breast then you should schedule a follow up office visit. If abnormal results  ?Further imaging will be warranted and sooner follow up as determined by the radiologist at the [facility].  ? ?[facility] ?[place], [place] [address]  ?Main: [phone]   ? ? ?Health Maintenance, Female ?Adopting a healthy lifestyle and getting preventive care are important in promoting health and wellness. Ask your health care provider about: ?The right schedule for you to have regular tests and exams. ?Things you can do on your own to prevent diseases and keep yourself healthy. ?What should I know about diet, weight, and exercise? ?Eat a healthy diet ? ?Eat a diet that includes plenty of vegetables, fruits, low-fat dairy products, and lean protein. ?Do not eat a lot of foods that are high in solid fats, added sugars, or sodium. ?Maintain a healthy weight ?Body mass index (BMI) is used to identify weight problems. It estimates body fat based on height and weight. Your health care provider can help determine your BMI and help you achieve or maintain a healthy weight. ?Get regular exercise ?Get regular exercise. This is one of the most important things you can do for your health. Most adults should: ?Exercise for at least 150 minutes each week. The exercise should increase your heart rate and make you sweat (moderate-intensity exercise). ?Do strengthening exercises at least twice a week. This is in addition to the moderate-intensity exercise. ?Spend less time sitting. Even light  physical activity can be beneficial. ?Watch cholesterol and blood lipids ?Have your blood tested for lipids and cholesterol at 86 years of age, then have this test every 5 years. ?Have your cholesterol levels checked more often if: ?Your lipid or cholesterol levels are high. ?You are older than 86 years of age. ?You are at high risk for heart disease. ?What should I know about cancer screening? ?Depending on your health history and family history, you may need to have cancer screening at various ages. This may include screening for: ?Breast cancer. ?Cervical cancer. ?Colorectal cancer. ?Skin cancer. ?Lung cancer. ?What should I know about heart disease, diabetes, and high blood pressure? ?Blood pressure and heart disease ?High blood pressure causes heart disease and increases the risk of stroke. This is more likely to develop in people who have high blood pressure readings or are overweight. ?Have your blood pressure checked: ?Every 3-5 years if you are 62-37 years of age. ?Every year if you are 41 years old or older. ?Diabetes ?Have regular diabetes screenings. This checks your fasting blood sugar level. Have the screening done: ?Once every three years after age 74 if you are at a normal weight and have a low risk for diabetes. ?More often and at a younger age if you are overweight or have a high risk for diabetes. ?What should I know about preventing infection? ?Hepatitis B ?If you have a higher risk for hepatitis B, you should be screened for this virus. Talk with your health care provider to find out if you are at risk for hepatitis B infection. ?Hepatitis C ?Testing is recommended  for: ?Everyone born from [birth year] through 1963. ?Anyone with known risk factors for hepatitis C. ?Sexually transmitted infections (STIs) ?Get screened for STIs, including gonorrhea and chlamydia, if: ?You are sexually active and are younger than 86 years of age. ?You are older than 86 years of age and your health care provider tells you  that you are at risk for this type of infection. ?Your sexual activity has changed since you were last screened, and you are at increased risk for chlamydia or gonorrhea. Ask your health care provider if you are at risk. ?Ask your health care provider about whether you are at high risk for HIV. Your health care provider may recommend a prescription medicine to help prevent HIV infection. If you choose to take medicine to prevent HIV, you should first get tested for HIV. You should then be tested every 3 months for as long as you are taking the medicine. ?Pregnancy ?If you are about to stop having your period (premenopausal) and you may become pregnant, seek counseling before you get pregnant. ?Take 400 to 800 micrograms (mcg) of folic acid every day if you become pregnant. ?Ask for birth control (contraception) if you want to prevent pregnancy. ?Osteoporosis and menopause ?Osteoporosis is a disease in which the bones lose minerals and strength with aging. This can result in bone fractures. If you are 38 years old or older, or if you are at risk for osteoporosis and fractures, ask your health care provider if you should: ?Be screened for bone loss. ?Take a calcium or vitamin D supplement to lower your risk of fractures. ?Be given hormone replacement therapy (HRT) to treat symptoms of menopause. ?Follow these instructions at home: ?Alcohol use ?Do not drink alcohol if: ?Your health care provider tells you not to drink. ?You are pregnant, may be pregnant, or are planning to become pregnant. ?If you drink alcohol: ?Limit how much you have to: ?0-1 drink a day. ?Know how much alcohol is in your drink. In the [place] one drink equals one 12 oz bottle of beer (355 mL), one 5 oz glass of wine (148 mL), or one 1? oz glass of hard liquor (44 mL). ?Lifestyle ?Do not use any products that contain nicotine or tobacco. These products include cigarettes, chewing tobacco, and vaping devices, such as e-cigarettes. If you need help  quitting, ask your health care provider. ?Do not use street drugs. ?Do not share needles. ?Ask your health care provider for help if you need support or information about quitting drugs. ?General instructions ?Schedule regular health, dental, and eye exams. ?Stay current with your vaccines. ?Tell your health care provider if: ?You often feel depressed. ?You have ever been abused or do not feel safe at home. ?Summary ?Adopting a healthy lifestyle and getting preventive care are important in promoting health and wellness. ?Follow your health care provider's instructions about healthy diet, exercising, and getting tested or screened for diseases. ?Follow your health care provider's instructions on monitoring your cholesterol and blood pressure. ?This information is not intended to replace advice given to you by your health care provider. Make sure you discuss any questions you have with your health care provider. ?Document Revised: 09/26/2020 Document Reviewed: 09/26/2020 ?Elsevier Patient Education ? 2022 [place]. ? ? ?

## 2021-08-03 NOTE — Progress Notes (Signed)
? ?Subjective:  ? ? Patient ID: Janice Bush, female    DOB: [birth year], 88 y.o.   MRN: [id number] ? ?CC: Janice Bush is a 88 y.o. female who presents today for physical exam.   ? ?HPI: HPI ?Patient is a 88 year old female in no acute distress who comes for CPE today.  ? ?She did see Dr. Candis Bush for new patient cardiology as referred for persistent chronic atrial fibrillation.  ?Per cardiology :  ?Permanent atrial fibrillation ?On Eliquis, ?Metoprolol previously held by outpatient primary care at [facility] ?Recommend she decrease digoxin in half, down to 0.125 mg daily given bradycardia ?Compliance with Eliquis recommended to prevent TIA and stroke symptoms ?PVCs ?Noted on EKG, asymptomatic ?Monitor for now ?  ?Has opthalmology exam on Monday next week,  ? ?She had labs done at [place] . I do not have those results she did not bring but is having them fax over.  ? ?Colorectal Cancer Screening: prefers no further colonoscopy - no bleeding no dark tarry stools.  ?Breast Cancer Screening: Mammogram UTD ordered at [facility] today. ?Cervical Cancer Screening:  history of total hysterectomy, history of endometrial cancer - she had no treatment and only a total hysterectomy by vaginal robotic. She denies any urinary symptoms, she does have vaginal itching for the past year she reports she has tried hydrocortisone cream, yeast cream unknown which one. She denies any vaginal spotting. She has persistent irritation. She does have urinary leakage at times.  ? ?Bone Health screening/DEXA for 65+: No increased fracture risk. Defer screening at this time.  Bone scan was at [facility] one year ago IMPRESSION:  ?Lumbar spine: Normal bone density.  The measurement has increased significantly since recent and baseline studies.  ? ?Left proximal femur:Osteoporosis.  The measurement has decreased significantly since recent and baseline studies. ?Exam End: 08/03/20 14:13  ?She was given prescription for osteoporosis but prefers  not to take and just optimize in diet and also take vitamin D. No history of previous fracture.  ? ?      Tetanus - TDAP 03/20/2018 ?       Pneumococcal - vaccine Pneumo 13 given 04/24/1999/ Pneum Poly 23 was 12/4/200 ?Hepatitis C screening - Candidate not  done not wanted.  ?HIV Screening- Candidate for not done not wanted.  ?Labs: Screening labs done at [facility] [place], need labs copy do not have access to these.  ?Exercise: Gets regular exercise.   ? ?Smoking/tobacco use: Nonsmoker.   ? ? reports that she has never smoked. She has never used smokeless tobacco. She reports that she does not drink alcohol and does not use drugs.   ? ?Patient  denies any fever, body aches,chills, rash, chest pain, shortness of breath, nausea, vomiting, or diarrhea.  ?Denies dizziness, lightheadedness, pre syncopal or syncopal episodes.  ? ? ?Past Surgical History:  ?Procedure Laterality Date  ? ROBOTIC ASSISTED LAP VAGINAL HYSTERECTOMY N/A   ? Full hysterectomy per patient was not abdominal was robotic.  ? TONSILLECTOMY AND ADENOIDECTOMY    ?  ?HISTORY:  ?Past Medical History:  ?Diagnosis Date  ? Afib ([place])   ? Allergy   ? Arthritis   ? Cancer [facility])   ? Glaucoma   ? Heart murmur   ? Hyperlipidemia   ? Hypertension   ? Migraines   ?  ?Past Surgical History:  ?Procedure Laterality Date  ? ROBOTIC ASSISTED LAP VAGINAL HYSTERECTOMY N/A   ? Full hysterectomy per patient was not abdominal was robotic.  ?  TONSILLECTOMY AND ADENOIDECTOMY    ? ?Family History  ?Problem Relation Age of Onset  ? Hypertension Mother   ? Early death Mother   ? Cancer Mother   ? Early death Father   ? Alcohol abuse Father   ? Heart attack Father   ? Hypertension Brother   ? Hyperlipidemia Brother   ? Heart attack Brother   ? Early death Brother   ? Diabetes Brother   ? Heart attack Brother   ? Early death Brother   ? ?  ? ?ALLERGIES: Tramadol and Diphenhydramine hcl ? ?Current Outpatient Medications on File Prior to Visit  ?Medication Sig Dispense Refill  ?  amLODipine (NORVASC) 5 MG tablet Take 1 tablet by mouth daily.    ? apixaban (ELIQUIS) 2.5 MG TABS tablet Take 1 tablet (2.5 mg total) by mouth 2 (two) times daily. 60 tablet 11  ? ascorbic acid (VITAMIN C) 500 MG tablet Take by mouth.    ? Cholecalciferol (VITAMIN D3 PO) Take by mouth.    ? Cyanocobalamin (VITAMIN B 12) 100 MCG LOZG Take by mouth daily.    ? digoxin (LANOXIN) 0.125 MG tablet Take 1 tablet (0.125 mg total) by mouth daily. 90 tablet 1  ? ferrous sulfate 324 MG TBEC Take 324 mg by mouth.    ? gabapentin (NEURONTIN) 100 MG capsule Take by mouth.    ? gabapentin (NEURONTIN) 300 MG capsule Take by mouth.    ? hydrocortisone 2.5 % ointment Apply 1 application TWICE DAILY.    ? LORazepam (ATIVAN) 0.5 MG tablet Take by mouth.    ? losartan (COZAAR) 25 MG tablet Take 1 tablet (25 mg total) by mouth in the morning and at bedtime.    ? timolol (TIMOPTIC-XR) 0.5 % ophthalmic gel-forming Administer 1 drop to both eyes Take as directed.    ? ?No current facility-administered medications on file prior to visit.  ? ? ?Social History  ? ?Tobacco Use  ? Smoking status: Never  ? Smokeless tobacco: Never  ?Vaping Use  ? Vaping Use: Never used  ?Substance Use Topics  ? Alcohol use: Never  ? Drug use: Never  ? ? ?Review of Systems  ?Constitutional: Negative.   ?HENT: Negative.    ?Respiratory: Negative.    ?Cardiovascular: Negative.   ?Gastrointestinal: Negative.   ?Genitourinary: Negative.   ?Musculoskeletal:  Positive for arthralgias. Negative for back pain, gait problem, joint swelling, myalgias, neck pain and neck stiffness.  ?Skin:   ?     Patient reports sebaceous cyst under left axillary has been present for over 10 years was  drained once by a doctor, and she has left it alone, no pain, no irritation, she does not want [place] or surgical consult at this time.   ?Neurological: Negative.   ?Hematological: Negative.   ?Psychiatric/Behavioral:  Negative for agitation, dysphoric mood, hallucinations, self-injury, sleep  disturbance and suicidal ideas. The patient is nervous/anxious. The patient is not hyperactive.   ?   ?Objective:  ?  ?BP 136/60 (BP Location: Left Arm, Patient Position: Sitting, Cuff Size: Small) Comment: was anxious at beggining of visit as provider leaving she could not figure out who to [place].  Pulse 85   Temp 98 ?F (36.7 ?C) (Oral)   Ht '5\' 5"'$  (1.651 m)   Wt 131 lb (59.4 kg)   SpO2 96%   BMI 21.80 kg/m?  ? ? ?Wt Readings from Last 3 Encounters:  ?08/03/21 131 lb (59.4 kg)  ?  05/30/21 130 lb 2 oz (59 kg)  ?05/04/21 131 lb (59.4 kg)  ? ? ?Physical Exam ?Exam conducted with a chaperone present.  ?Constitutional:   ?   General: She is not in acute distress. ?   Appearance: Normal appearance. She is normal weight. She is not ill-appearing, toxic-appearing or diaphoretic.  ?HENT:  ?   Head: Normocephalic and atraumatic.  ?   Right Ear: Tympanic membrane, ear canal and external ear normal.  ?   Left Ear: Tympanic membrane, ear canal and external ear normal.  ?   Nose: Nose normal. No congestion or rhinorrhea.  ?   Mouth/Throat:  ?   Mouth: Mucous membranes are moist.  ?Eyes:  ?   General: No scleral icterus.    ?   Right eye: No discharge.     ?   Left eye: No discharge.  ?   Extraocular Movements: Extraocular movements intact.  ?   Conjunctiva/sclera: Conjunctivae normal.  ?   Pupils: Pupils are equal, round, and reactive to light.  ?Neck:  ?   Vascular: No carotid bruit.  ?Cardiovascular:  ?   Rate and Rhythm: Normal rate and regular rhythm.  ?   Pulses: Normal pulses.  ?   Heart sounds: Normal heart sounds. No murmur heard. ?  No friction rub. No gallop.  ?Pulmonary:  ?   Effort: Pulmonary effort is normal. No respiratory distress.  ?   Breath sounds: Normal breath sounds. No stridor. No wheezing, rhonchi or rales.  ?Chest:  ?   Chest wall: No tenderness.  ?Abdominal:  ?   General: Bowel sounds are normal. There is no distension.  ?   Palpations: Abdomen is soft. There is no  mass.  ?   Tenderness: There is no abdominal tenderness. There is no right CVA tenderness, left CVA tenderness, guarding or rebound.  ?   Hernia: No hernia is present. There is no hernia in the left inguinal area o

## 2021-08-04 LAB — CERVICOVAGINAL ANCILLARY ONLY
Bacterial Vaginitis (gardnerella): NEGATIVE
Candida Glabrata: NEGATIVE
Candida Vaginitis: NEGATIVE
Comment: NEGATIVE
Comment: NEGATIVE
Comment: NEGATIVE
Comment: NEGATIVE
Trichomonas: NEGATIVE

## 2021-08-08 ENCOUNTER — Encounter: Payer: Self-pay | Admitting: Adult Health

## 2021-08-20 ENCOUNTER — Encounter: Payer: Self-pay | Admitting: Family

## 2021-09-05 ENCOUNTER — Ambulatory Visit: Payer: Medicare PPO | Admitting: Obstetrics and Gynecology

## 2021-09-05 ENCOUNTER — Encounter: Payer: Self-pay | Admitting: Obstetrics and Gynecology

## 2021-09-05 VITALS — BP 122/60 | Ht 65.0 in | Wt 133.0 lb

## 2021-09-05 DIAGNOSIS — N761 Subacute and chronic vaginitis: Secondary | ICD-10-CM | POA: Diagnosis not present

## 2021-09-05 NOTE — Progress Notes (Signed)
88 yo postmenopausal with BMI 22 who is here for the evaluation of chronic vaginitis. Patient has a long standing history of vaginitis. She has tried estrogen cream and hydrocortisone cream without improvement in her symptoms. She was recently prescribed Nystatin and reports feeling better. She has a history of endometrial cancer and underwent [place] in 2012. Patient is without any other complaints. She is current on her mammogram and declines further colonoscopy ? ?Past Medical History:  ?Diagnosis Date  ? Afib ([place])   ? Allergy   ? Arthritis   ? Cancer [facility])   ? Glaucoma   ? Heart murmur   ? Hyperlipidemia   ? Hypertension   ? Migraines   ? ?Past Surgical History:  ?Procedure Laterality Date  ? ROBOTIC ASSISTED LAP VAGINAL HYSTERECTOMY N/A   ? Full hysterectomy per patient was not abdominal was robotic.  ? TONSILLECTOMY AND ADENOIDECTOMY    ? ?Family History  ?Problem Relation Age of Onset  ? Hypertension Mother   ? Early death Mother   ? Cancer Mother   ? Early death Father   ? Alcohol abuse Father   ? Heart attack Father   ? Hypertension Brother   ? Hyperlipidemia Brother   ? Heart attack Brother   ? Early death Brother   ? Diabetes Brother   ? Heart attack Brother   ? Early death Brother   ? ?Social History  ? ?Tobacco Use  ? Smoking status: Never  ? Smokeless tobacco: Never  ?Vaping Use  ? Vaping Use: Never used  ?Substance Use Topics  ? Alcohol use: Never  ? Drug use: Never  ? ?ROS ?See pertinent in HPI. All other systems reviewed and non contributory ?Blood pressure 122/60, height '5\' 5"'$  (1.651 m), weight 133 lb (60.3 kg). ?GENERAL: Well-developed, well-nourished female in no acute distress.  ?ABDOMEN: Soft, nontender, nondistended. No organomegaly. ?PELVIC: Normal external female genitalia without any erythema in the affected region. Vagina is pale and atrophic.  Normal discharge. Chaperone present during the pelvic exam ?EXTREMITIES: No cyanosis, clubbing, or edema, 2+ distal pulses. ? ?A/P 88 yo with  atrophic vaginitis ?- No acute findings on today's exam ?- Advised to continue using Nystatin cream prn and to apply a skin protectant daily ?- RTC prn ?

## 2021-09-26 ENCOUNTER — Encounter: Payer: Self-pay | Admitting: Family

## 2021-09-26 ENCOUNTER — Ambulatory Visit (INDEPENDENT_AMBULATORY_CARE_PROVIDER_SITE_OTHER): Payer: Medicare PPO | Admitting: Family

## 2021-09-26 VITALS — BP 118/64 | HR 74 | Temp 98.7°F | Resp 16 | Ht 65.0 in | Wt 131.3 lb

## 2021-09-26 DIAGNOSIS — Z8542 Personal history of malignant neoplasm of other parts of uterus: Secondary | ICD-10-CM

## 2021-09-26 DIAGNOSIS — E870 Hyperosmolality and hypernatremia: Secondary | ICD-10-CM | POA: Diagnosis not present

## 2021-09-26 DIAGNOSIS — I480 Paroxysmal atrial fibrillation: Secondary | ICD-10-CM

## 2021-09-26 DIAGNOSIS — J301 Allergic rhinitis due to pollen: Secondary | ICD-10-CM | POA: Diagnosis not present

## 2021-09-26 DIAGNOSIS — N952 Postmenopausal atrophic vaginitis: Secondary | ICD-10-CM

## 2021-09-26 DIAGNOSIS — D509 Iron deficiency anemia, unspecified: Secondary | ICD-10-CM | POA: Insufficient documentation

## 2021-09-26 DIAGNOSIS — H409 Unspecified glaucoma: Secondary | ICD-10-CM | POA: Insufficient documentation

## 2021-09-26 DIAGNOSIS — I10 Essential (primary) hypertension: Secondary | ICD-10-CM

## 2021-09-26 DIAGNOSIS — E782 Mixed hyperlipidemia: Secondary | ICD-10-CM

## 2021-09-26 DIAGNOSIS — G43109 Migraine with aura, not intractable, without status migrainosus: Secondary | ICD-10-CM

## 2021-09-26 DIAGNOSIS — F5101 Primary insomnia: Secondary | ICD-10-CM

## 2021-09-26 LAB — CBC WITH DIFFERENTIAL/PLATELET
Basophils Absolute: 0 10*3/uL (ref 0.0–0.1)
Basophils Relative: 0.5 % (ref 0.0–3.0)
Eosinophils Absolute: 0.2 10*3/uL (ref 0.0–0.7)
Eosinophils Relative: 3.2 % (ref 0.0–5.0)
HCT: 42 % (ref 36.0–46.0)
Hemoglobin: 13.7 g/dL (ref 12.0–15.0)
Lymphocytes Relative: 19.5 % (ref 12.0–46.0)
Lymphs Abs: 1.3 10*3/uL (ref 0.7–4.0)
MCHC: 32.7 g/dL (ref 30.0–36.0)
MCV: 89.6 fl (ref 78.0–100.0)
Monocytes Absolute: 0.9 10*3/uL (ref 0.1–1.0)
Monocytes Relative: 12.6 % — ABNORMAL HIGH (ref 3.0–12.0)
Neutro Abs: 4.3 10*3/uL (ref 1.4–7.7)
Neutrophils Relative %: 64.2 % (ref 43.0–77.0)
Platelets: 265 10*3/uL (ref 150.0–400.0)
RBC: 4.69 Mil/uL (ref 3.87–5.11)
RDW: 14.5 % (ref 11.5–15.5)
WBC: 6.8 10*3/uL (ref 4.0–10.5)

## 2021-09-26 LAB — BASIC METABOLIC PANEL
BUN: 15 mg/dL (ref 6–23)
CO2: 31 mEq/L (ref 19–32)
Calcium: 10 mg/dL (ref 8.4–10.5)
Chloride: 101 mEq/L (ref 96–112)
Creatinine, Ser: 0.68 mg/dL (ref 0.40–1.20)
GFR: 78.28 mL/min (ref >60.00)
Glucose, Bld: 57 mg/dL — ABNORMAL LOW (ref 70–99)
Potassium: 4.2 mEq/L (ref 3.5–5.1)
Sodium: 138 mEq/L (ref 135–145)

## 2021-09-26 LAB — IBC + FERRITIN
Ferritin: 29.9 ng/mL (ref 10.0–291.0)
Iron: 135 ug/dL (ref 42–145)
Saturation Ratios: 35.1 % (ref 20.0–50.0)
TIBC: 385 ug/dL (ref 250.0–450.0)
Transferrin: 275 mg/dL (ref 212.0–360.0)

## 2021-09-26 MED ORDER — CLOBETASOL PROPIONATE 0.05 % EX CREA: 1.0000 "application " | TOPICAL_CREAM | Freq: Two times a day (BID) | CUTANEOUS | 0 refills | Status: DC

## 2021-09-26 MED ORDER — FLUTICASONE PROPIONATE 50 MCG/ACT NA SUSP: 2.0000 | Freq: Every day | NASAL | 6 refills | Status: DC

## 2021-09-26 MED ORDER — LORAZEPAM 0.5 MG PO TABS: 0.5000 mg | ORAL_TABLET | Freq: Every evening | ORAL | 0 refills | Status: DC | PRN

## 2021-09-26 NOTE — Assessment & Plan Note (Signed)
Reviewed last lipid panel ?Work on low cholesterol diet and exercise as tolerated ? ?

## 2021-09-26 NOTE — Assessment & Plan Note (Signed)
Continue f/u with gyn as scheduled.  

## 2021-09-26 NOTE — Assessment & Plan Note (Signed)
Repeat bmp pending results ?Watch sodium intake ?

## 2021-09-26 NOTE — Assessment & Plan Note (Signed)
Continue amlodipine 5 mg once daily  Pt advised of the following:  Continue medication as prescribed. Monitor blood pressure periodically and/or when you feel symptomatic. Goal is <130/90 on average. Ensure that you have rested for 30 minutes prior to checking your blood pressure. Record your readings and bring them to your next visit if necessary.work on a low sodium diet.  

## 2021-09-26 NOTE — Assessment & Plan Note (Signed)
pdmp reviewed ?Updated controlled substance contrast ?rx refill ativan 0.5 mg nightly prn insomnia  ?Discussed good sleep hygiene ? ?

## 2021-09-26 NOTE — Assessment & Plan Note (Signed)
Continue drops as prescribed ?Continue f/u with opthamologist as scheduled. ?

## 2021-09-26 NOTE — Assessment & Plan Note (Signed)
Try to avoid triggers as able.  ?Stable. Frequency controlled. ?

## 2021-09-26 NOTE — Patient Instructions (Addendum)
Recommend starting daily flonase.  ? ?Stop by the lab prior to leaving today. I will notify you of your results once received.  ? ?Sending in clobetasol to use twice daily vaginally externally , to see if improvement  ? ?Welcome to our clinic, I am happy to have you as my new patient. I am excited to continue on this healthcare journey with you. ? ?Stop by the lab prior to leaving today. I will notify you of your results once received.  ? ?Please keep in mind ?Any my chart messages you send have p to a three business day turnaround for a response.  ?Phone calls may have up to a one day business turnaround for a  response.  ? ?If you need a medication refill I recommend you request it through the pharmacy as this is easiest for [place] rather than sending a message and or phone call.  ? ?Due to recent changes in healthcare laws, you may see results of your imaging and/or laboratory studies on MyChart before I have had a chance to review them.  I understand that in some cases there may be results that are confusing or concerning to you. Please understand that not all results are received at the same time and often I may need to interpret multiple results in order to provide you with the best plan of care or course of treatment. Therefore, I ask that you please give me 2 business days to thoroughly review all your results before contacting my office for clarification. Should we see a critical lab result, you will be contacted sooner.  ? ?It was a pleasure seeing you today! Please do not hesitate to reach out with any questions and or concerns. ? ?Regards,  ? ?Loie Jahr ?FNP-C ? ?

## 2021-09-26 NOTE — Assessment & Plan Note (Signed)
Recommend daily flonase 50 mcg nose spray ?

## 2021-09-26 NOTE — Assessment & Plan Note (Signed)
Continue f/u with cardiologist.  ?Continue dignoxin as well as eliquis as prescribed. ?

## 2021-09-26 NOTE — Progress Notes (Signed)
? ?Established Patient Office Visit ? ?Subjective:  ?Patient ID: Janice Bush, female    DOB: 11-Aug-1931  Age: 88 y.o. MRN: [id number] ? ?CC:  ?Chief Complaint  ?Patient presents with  ? Transitions Of Care  ? ? ?HPI ?[name] is here for a transition of care visit. ? ?Prior provider was: Laverna Peace, FNP ?Pt is without acute concerns.  ? ?chronic concerns: ? ?Afib: on eliquis 2.5 mg twice daily as well as digoxin 0.125 mg daily, permanent afib, rate controlled.  ? ?Migraine:outgrew them, no longer experiencing them since age 62's does have ocular migraine about twice a month. But states manageable. ? ?MWN:UUVOZD amlodipine 5 mg once daily tolerating ok. Blood pressure better controlled.  Still taking lsoartan 25 mg twice daily. ? ?Glaucoma: timolol opth gel, seeing opthalmology  ? ?H/o Endometrial Cancer:total hysterectomy 2012, surgery put her in remission.  ? ?HLD: ?Total chol 263, triglycerides 153 as well as ldl 182, was fasting.  ? ?Insomnia: taking 300 mg qhs for sleep. Has option to take 100 mg gabapentin if needed. Will wake up in three hours to go pee, and then has a hard time going back to sleep. She will then take half of ativan 0.5 mg, is having to use nearly every night. She finds this routine helps her and is working out well for her.w ? ?New anemia back in December and was symptomatic, hgn was 10 per pt however repeat march 2023 was back up to 13.6. pt started taking iron otc ferrous sulfate 324 mg, taking every other day at this time. Cardiologist also cut eliquis in half for afib in case blood loss resulting from  ? ?Past Medical History:  ?Diagnosis Date  ? Afib ([place])   ? Allergy   ? Arthritis   ? Cancer [facility])   ? Glaucoma   ? Heart murmur   ? Hyperlipidemia   ? Hypertension   ? Migraine without status migrainosus, not intractable 11/19/2012  ? Migraines   ? ? ?Past Surgical History:  ?Procedure Laterality Date  ? ROBOTIC ASSISTED LAP VAGINAL HYSTERECTOMY N/A   ? Full hysterectomy  per patient was not abdominal was robotic.  ? TONSILLECTOMY AND ADENOIDECTOMY    ? ? ?Family History  ?Problem Relation Age of Onset  ? Hypertension Mother   ? Early death Mother   ? Lung cancer Mother   ?     smoker  ? Early death Father   ? Alcohol abuse Father   ? Heart attack Father 4  ? Hypertension Brother   ? Hyperlipidemia Brother   ? Heart attack Brother 51  ? Early death Brother   ? Diabetes Brother   ? Heart attack Brother 30  ? Early death Brother   ? Other Paternal Grandfather   ?     guillan barre syndrome  ? ? ?Social History  ? ?Socioeconomic History  ? Marital status: Widowed  ?  Spouse name: Not on file  ? Number of children: Not on file  ? Years of education: Not on file  ? Highest education level: Not on file  ?Occupational History  ? Not on file  ?Tobacco Use  ? Smoking status: Never  ? Smokeless tobacco: Never  ?Vaping Use  ? Vaping Use: Never used  ?Substance and Sexual Activity  ? Alcohol use: Never  ? Drug use: Never  ? Sexual activity: Not Currently  ?Other Topics Concern  ? Not on file  ?Social History Narrative  ? Not on file  ? ?  Social Determinants of Health  ? ?Financial Resource Strain: Not on file  ?Food Insecurity: Not on file  ?Transportation Needs: Not on file  ?Physical Activity: Not on file  ?Stress: Not on file  ?Social Connections: Not on file  ?Intimate Partner Violence: Not on file  ? ? ?Outpatient Medications Prior to Visit  ?Medication Sig Dispense Refill  ? amLODipine (NORVASC) 5 MG tablet Take 1 tablet by mouth daily.    ? apixaban (ELIQUIS) 2.5 MG TABS tablet Take 1 tablet (2.5 mg total) by mouth 2 (two) times daily. 60 tablet 11  ? ascorbic acid (VITAMIN C) 500 MG tablet Take by mouth.    ? Cholecalciferol (VITAMIN D3 PO) Take by mouth.    ? Cyanocobalamin (VITAMIN B 12) 100 MCG LOZG Take by mouth daily.    ? digoxin (LANOXIN) 0.125 MG tablet Take 1 tablet (0.125 mg total) by mouth daily. 90 tablet 1  ? ferrous sulfate 324 MG TBEC Take 324 mg by mouth.    ? gabapentin  (NEURONTIN) 100 MG capsule Take by mouth.    ? gabapentin (NEURONTIN) 300 MG capsule Take by mouth.    ? hydrocortisone 2.5 % ointment Apply 1 application TWICE DAILY.    ? losartan (COZAAR) 25 MG tablet Take 1 tablet (25 mg total) by mouth in the morning and at bedtime.    ? nystatin cream (MYCOSTATIN) Apply 1 application. topically 2 (two) times daily. 30 g 0  ? timolol (TIMOPTIC-XR) 0.5 % ophthalmic gel-forming Administer 1 drop to both eyes Take as directed.    ? LORazepam (ATIVAN) 0.5 MG tablet Take by mouth.    ? ?No facility-administered medications prior to visit.  ? ? ?Allergies  ?Allergen Reactions  ? Tramadol Other (See Comments)  ?  Felt groggy and confused for a few days after taking one dose.  ? Diphenhydramine Hcl Anxiety  ? ? ?ROS ?Review of Systems  ?Constitutional:  Negative for chills and fatigue.  ?Respiratory:  Negative for cough and shortness of breath.   ?Cardiovascular:  Negative for chest pain and leg swelling.  ?Gastrointestinal:  Negative for diarrhea and nausea.  ?Genitourinary:  Negative for difficulty urinating, menstrual problem and vaginal bleeding.  ?Neurological:  Negative for dizziness and headaches.  ?Psychiatric/Behavioral:  Negative for agitation and sleep disturbance.   ?All other systems reviewed and are negative. ? ? ?  ?Objective:  ?  ?Physical Exam ?Vitals reviewed.  ?Constitutional:   ?   General: She is not in acute distress. ?   Appearance: Normal appearance. She is not ill-appearing or toxic-appearing.  ?HENT:  ?   Right Ear: Tympanic membrane normal.  ?   Left Ear: Tympanic membrane normal.  ?   Mouth/Throat:  ?   Mouth: Mucous membranes are moist.  ?   Pharynx: No pharyngeal swelling.  ?   Tonsils: No tonsillar exudate.  ?Eyes:  ?   Extraocular Movements: Extraocular movements intact.  ?   Conjunctiva/sclera: Conjunctivae normal.  ?   Pupils: Pupils are equal, round, and reactive to light.  ?Neck:  ?   Thyroid: No thyroid mass.  ?Cardiovascular:  ?   Rate and Rhythm:  Normal rate and regular rhythm.  ?Pulmonary:  ?   Effort: Pulmonary effort is normal.  ?   Breath sounds: Normal breath sounds.  ?Abdominal:  ?   General: Abdomen is flat. Bowel sounds are normal.  ?   Palpations: Abdomen is soft.  ?Musculoskeletal:     ?   General: Normal  range of motion.  ?Lymphadenopathy:  ?   Cervical:  ?   Right cervical: No superficial cervical adenopathy. ?   Left cervical: No superficial cervical adenopathy.  ?Skin: ?   General: Skin is warm.  ?   Capillary Refill: Capillary refill takes less than 2 seconds.  ?Neurological:  ?   General: No focal deficit present.  ?   Mental Status: She is alert and oriented to person, place, and time.  ?Psychiatric:     ?   Mood and Affect: Mood normal.     ?   Behavior: Behavior normal.     ?   Thought Content: Thought content normal.     ?   Judgment: Judgment normal.  ? ? ? ? ?BP 118/64   Pulse 74   Temp 98.7 ?F (37.1 ?C)   Resp 16   Ht 5' 5"  (1.651 m)   Wt 131 lb 5 oz (59.6 kg)   SpO2 96%   BMI 21.85 kg/m?  ?Wt Readings from Last 3 Encounters:  ?09/26/21 131 lb 5 oz (59.6 kg)  ?09/05/21 133 lb (60.3 kg)  ?08/03/21 131 lb (59.4 kg)  ? ? ? ?There are no preventive care reminders to display for this patient. ? ?There are no preventive care reminders to display for this patient. ? ?No results found for: TSH ?No results found for: WBC, HGB, HCT, MCV, PLT ?No results found for: NA, K, CHLORIDE, CO2, GLUCOSE, BUN, CREATININE, BILITOT, ALKPHOS, AST, ALT, PROT, ALBUMIN, CALCIUM, ANIONGAP, EGFR, GFR ?No results found for: CHOL ?No results found for: HDL ?No results found for: Anon Raices ?No results found for: TRIG ?No results found for: CHOLHDL ?No results found for: HGBA1C ? ?  ?Assessment & Plan:  ? ?Problem List Items Addressed This Visit   ? ?  ? Cardiovascular and Mediastinum  ? Essential hypertension  ?  Continue amlodipine 5 mg once daily. ?Pt advised of the following:  ?Continue medication as prescribed. Monitor blood pressure periodically and/or when  you feel symptomatic. Goal is <130/90 on average. Ensure that you have rested for 30 minutes prior to checking your blood pressure. Record your readings and bring them to your next visit if necessary.work o

## 2021-09-26 NOTE — Assessment & Plan Note (Signed)
rx clobetosol 0.05% cream ?

## 2021-09-26 NOTE — Assessment & Plan Note (Signed)
Cbc ordered pending results ?Continue ferrous sulfate 325 mg ?

## 2021-09-27 ENCOUNTER — Telehealth: Payer: Self-pay | Admitting: Family

## 2021-09-27 NOTE — Progress Notes (Signed)
Glucose on lower end, make sure pt is eating every 2-3 hours at least little small snacks to keep sugars from dropping. Iron is good. Ferritin (storage of iron) on lower end. Ok to continue with iron daily.

## 2021-09-27 NOTE — Telephone Encounter (Signed)
Patient returning call to office at the request of [place] ? ?Relayed message ? ?Left message to return call to our office.   ? Eugenia Pancoast, FNP  ?09/27/2021  8:18 AM EDT   ?  ?Glucose on lower end, make sure pt is eating every 2-3 hours at least little small snacks to keep sugars from dropping. Iron is good. Ferritin (storage of iron) on lower end. Ok to continue with iron daily.  ? ?

## 2021-10-09 ENCOUNTER — Encounter: Payer: Self-pay | Admitting: Family

## 2021-10-17 ENCOUNTER — Ambulatory Visit
Admission: RE | Admit: 2021-10-17 | Discharge: 2021-10-17 | Disposition: A | Discharge status: Home / Self Care | Payer: Medicare PPO | Source: Ambulatory Visit | Attending: Adult Health | Admitting: Adult Health

## 2021-10-17 ENCOUNTER — Other Ambulatory Visit: Payer: Self-pay | Admitting: Cardiovascular Disease

## 2021-10-17 DIAGNOSIS — Z1231 Encounter for screening mammogram for malignant neoplasm of breast: Secondary | ICD-10-CM | POA: Insufficient documentation

## 2021-10-20 ENCOUNTER — Inpatient Hospital Stay
Admission: RE | Admit: 2021-10-20 | Discharge: 2021-10-20 | Disposition: A | Discharge status: Home / Self Care | Payer: Self-pay | Source: Ambulatory Visit | Attending: *Deleted | Admitting: *Deleted

## 2021-10-20 ENCOUNTER — Other Ambulatory Visit: Payer: Self-pay | Admitting: *Deleted

## 2021-10-20 DIAGNOSIS — Z1231 Encounter for screening mammogram for malignant neoplasm of breast: Secondary | ICD-10-CM

## 2021-10-31 ENCOUNTER — Other Ambulatory Visit: Payer: Self-pay | Admitting: Family

## 2021-10-31 DIAGNOSIS — F5101 Primary insomnia: Secondary | ICD-10-CM

## 2021-11-13 ENCOUNTER — Other Ambulatory Visit: Payer: Self-pay | Admitting: Family

## 2021-11-13 DIAGNOSIS — N952 Postmenopausal atrophic vaginitis: Secondary | ICD-10-CM

## 2021-12-05 ENCOUNTER — Encounter: Payer: Self-pay | Admitting: Family

## 2021-12-11 ENCOUNTER — Ambulatory Visit: Payer: Medicare PPO | Admitting: Family

## 2021-12-11 ENCOUNTER — Encounter: Payer: Self-pay | Admitting: Family

## 2021-12-11 ENCOUNTER — Ambulatory Visit (INDEPENDENT_AMBULATORY_CARE_PROVIDER_SITE_OTHER)
Admission: RE | Admit: 2021-12-11 | Discharge: 2021-12-11 | Disposition: A | Discharge status: Home / Self Care | Payer: Medicare PPO | Source: Ambulatory Visit | Attending: Family | Admitting: Family

## 2021-12-11 VITALS — BP 114/60 | HR 64 | Temp 98.2°F | Resp 16 | Ht 65.0 in | Wt 131.5 lb

## 2021-12-11 DIAGNOSIS — R0609 Other forms of dyspnea: Secondary | ICD-10-CM

## 2021-12-11 DIAGNOSIS — R5383 Other fatigue: Secondary | ICD-10-CM | POA: Diagnosis not present

## 2021-12-11 DIAGNOSIS — M79671 Pain in right foot: Secondary | ICD-10-CM

## 2021-12-11 DIAGNOSIS — J301 Allergic rhinitis due to pollen: Secondary | ICD-10-CM | POA: Diagnosis not present

## 2021-12-11 LAB — B12 AND FOLATE PANEL
Folate: >24.2 ng/mL (ref >5.9)
Vitamin B-12: 488 pg/mL (ref 211–911)

## 2021-12-11 LAB — CBC WITH DIFFERENTIAL/PLATELET
Basophils Absolute: 0 10*3/uL (ref 0.0–0.1)
Basophils Relative: 0.4 % (ref 0.0–3.0)
Eosinophils Absolute: 0.2 10*3/uL (ref 0.0–0.7)
Eosinophils Relative: 2.8 % (ref 0.0–5.0)
HCT: 40.3 % (ref 36.0–46.0)
Hemoglobin: 13.4 g/dL (ref 12.0–15.0)
Lymphocytes Relative: 17.9 % (ref 12.0–46.0)
Lymphs Abs: 1.2 10*3/uL (ref 0.7–4.0)
MCHC: 33.2 g/dL (ref 30.0–36.0)
MCV: 90 fl (ref 78.0–100.0)
Monocytes Absolute: 0.6 10*3/uL (ref 0.1–1.0)
Monocytes Relative: 9.8 % (ref 3.0–12.0)
Neutro Abs: 4.5 10*3/uL (ref 1.4–7.7)
Neutrophils Relative %: 69.1 % (ref 43.0–77.0)
Platelets: 252 10*3/uL (ref 150.0–400.0)
RBC: 4.48 Mil/uL (ref 3.87–5.11)
RDW: 13.7 % (ref 11.5–15.5)
WBC: 6.5 10*3/uL (ref 4.0–10.5)

## 2021-12-11 LAB — BASIC METABOLIC PANEL
BUN: 12 mg/dL (ref 6–23)
CO2: 28 mEq/L (ref 19–32)
Calcium: 9.6 mg/dL (ref 8.4–10.5)
Chloride: 104 mEq/L (ref 96–112)
Creatinine, Ser: 0.68 mg/dL (ref 0.40–1.20)
GFR: 78.16 mL/min (ref >60.00)
Glucose, Bld: 115 mg/dL — ABNORMAL HIGH (ref 70–99)
Potassium: 4.1 mEq/L (ref 3.5–5.1)
Sodium: 140 mEq/L (ref 135–145)

## 2021-12-11 LAB — TSH: TSH: 1.93 u[IU]/mL (ref 0.35–5.50)

## 2021-12-11 MED ORDER — AZELASTINE HCL 0.1 % NA SOLN: 1.0000 | Freq: Two times a day (BID) | NASAL | 2 refills | Status: DC

## 2021-12-11 NOTE — Assessment & Plan Note (Signed)
Fatigue lab workup

## 2021-12-11 NOTE — Assessment & Plan Note (Signed)
Lung sounds WNL  Suspect maybe allergy related and will also r/o anemia or other causes of fatigue with lab

## 2021-12-11 NOTE — Patient Instructions (Signed)
Due to recent changes in healthcare laws, you may see results of your imaging and/or laboratory studies on MyChart before I have had a chance to review them.  I understand that in some cases there may be results that are confusing or concerning to you. Please understand that not all results are received at the same time and often I may need to interpret multiple results in order to provide you with the best plan of care or course of treatment. Therefore, I ask that you please give me 2 business days to thoroughly review all your results before contacting my office for clarification. Should we see a critical lab result, you will be contacted sooner.   It was a pleasure seeing you today! Please do not hesitate to reach out with any questions and or concerns.  Regards,   Ame Heagle FNP-C  

## 2021-12-11 NOTE — Progress Notes (Signed)
Established Patient Office Visit  Subjective:  Patient ID: Janice Bush, female    DOB: Dec 09, 1931  Age: 88 y.o. MRN: [id number]  CC:  Chief Complaint  Patient presents with   Foot Pain    Outside of right foot and under the arch X 5 weeks     HPI Janice Bush is here today with concerns  For the last five weeks or so outside of right foot and under the arch of the foot with pain. She denies any known injury or trauma to the foot. Worried it might be gout? More painful with walking. Worse with standing.   She has an appt with podiatrist tomorrow.   Has noticed with increasing fatigue.  More doe.   Past Medical History:  Diagnosis Date   Afib ([place])    Allergy    Arthritis    Cancer ([place])    Glaucoma    Heart murmur    Hyperlipidemia    Hypertension    Migraine without status migrainosus, not intractable 11/19/2012   Migraines    Uterine cancer ([place]) 2012    Past Surgical History:  Procedure Laterality Date   BREAST LUMPECTOMY Left 1970   ROBOTIC ASSISTED LAP VAGINAL HYSTERECTOMY N/A    Full hysterectomy per patient was not abdominal was robotic.   TONSILLECTOMY AND ADENOIDECTOMY      Family History  Problem Relation Age of Onset   Hypertension Mother    Early death Mother    Lung cancer Mother        smoker   Early death Father    Alcohol abuse Father    Heart attack Father 46   Hypertension Brother    Hyperlipidemia Brother    Heart attack Brother 3   Early death Brother    Diabetes Brother    Heart attack Brother 13   Early death Brother    Other Paternal Grandfather        guillan barre syndrome    Social History   Socioeconomic History   Marital status: Widowed    Spouse name: Not on file   Number of children: Not on file   Years of education: Not on file   Highest education level: Not on file  Occupational History   Not on file  Tobacco Use   Smoking status: Never   Smokeless tobacco: Never  Vaping Use   Vaping Use: Never  used  Substance and Sexual Activity   Alcohol use: Never   Drug use: Never   Sexual activity: Not Currently  Other Topics Concern   Not on file  Social History Narrative   Not on file   Social Determinants of Health   Financial Resource Strain: Not on file  Food Insecurity: Not on file  Transportation Needs: Not on file  Physical Activity: Not on file  Stress: Not on file  Social Connections: Not on file  Intimate Partner Violence: Not on file    Outpatient Medications Prior to Visit  Medication Sig Dispense Refill   amLODipine (NORVASC) 5 MG tablet Take 1 tablet by mouth daily.     apixaban (ELIQUIS) 2.5 MG TABS tablet Take 1 tablet (2.5 mg total) by mouth 2 (two) times daily. 60 tablet 11   ascorbic acid (VITAMIN C) 500 MG tablet Take by mouth.     Cholecalciferol (VITAMIN D3 PO) Take by mouth.     clobetasol cream (TEMOVATE) 1.60 % APPLY 1 APPLICATION TOPICALLY 2 TIMES DAILY. 30 g 0   digoxin (LANOXIN)  0.125 MG tablet TAKE 1 TABLET BY MOUTH ONCE DAILY 90 tablet 0   ferrous sulfate 324 MG TBEC Take 324 mg by mouth.     fluticasone (FLONASE) 50 MCG/ACT nasal spray Place 2 sprays into both nostrils daily. 16 g 6   gabapentin (NEURONTIN) 100 MG capsule Take by mouth.     gabapentin (NEURONTIN) 300 MG capsule Take by mouth.     LORazepam (ATIVAN) 0.5 MG tablet TAKE 1 TABLET BY MOUTH AT BEDTIME AS NEEDED FOR SLEEP OR ANXIETY. 30 tablet 0   losartan (COZAAR) 25 MG tablet Take 1 tablet (25 mg total) by mouth in the morning and at bedtime.     timolol (TIMOPTIC-XR) 0.5 % ophthalmic gel-forming Administer 1 drop to both eyes Take as directed.     Cyanocobalamin (VITAMIN B 12) 100 MCG LOZG Take by mouth daily.     hydrocortisone 2.5 % ointment Apply 1 application TWICE DAILY.     nystatin cream (MYCOSTATIN) Apply 1 application. topically 2 (two) times daily. 30 g 0   No facility-administered medications prior to visit.    Allergies  Allergen Reactions   Tramadol Other (See  Comments)    Felt groggy and confused for a few days after taking one dose.   Diphenhydramine Hcl Anxiety        Objective:    Physical Exam Constitutional:      General: She is not in acute distress.    Appearance: Normal appearance. She is normal weight. She is not ill-appearing, toxic-appearing or diaphoretic.  Cardiovascular:     Rate and Rhythm: Normal rate and regular rhythm.  Pulmonary:     Effort: Pulmonary effort is normal.     Breath sounds: Normal breath sounds.  Musculoskeletal:     Right foot: Decreased range of motion (pain with hypextension in the arch of the foot).       Feet:  Neurological:     General: No focal deficit present.     Mental Status: She is alert and oriented to person, place, and time.  Psychiatric:        Mood and Affect: Mood normal.        Behavior: Behavior normal.        Thought Content: Thought content normal.        Judgment: Judgment normal.     BP 114/60   Pulse 64   Temp 98.2 F (36.8 C)   Resp 16   Ht '5\' 5"'$  (1.651 m)   Wt 131 lb 8 oz (59.6 kg)   SpO2 95%   BMI 21.88 kg/m  Wt Readings from Last 3 Encounters:  12/11/21 131 lb 8 oz (59.6 kg)  09/26/21 131 lb 5 oz (59.6 kg)  09/05/21 133 lb (60.3 kg)     There are no preventive care reminders to display for this patient.  There are no preventive care reminders to display for this patient.  No results found for: "TSH" Lab Results  Component Value Date   WBC 6.8 09/26/2021   HGB 13.7 09/26/2021   HCT 42.0 09/26/2021   MCV 89.6 09/26/2021   PLT 265.0 09/26/2021   Lab Results  Component Value Date   NA 138 09/26/2021   K 4.2 09/26/2021   CO2 31 09/26/2021   GLUCOSE 57 (L) 09/26/2021   BUN 15 09/26/2021   CREATININE 0.68 09/26/2021   CALCIUM 10.0 09/26/2021   GFR 78.28 09/26/2021   No results found for: "HGBA1C"    Assessment & Plan:  Problem List Items Addressed This Visit       Respiratory   Allergic rhinitis due to pollen    Continue flonase 50  mcg  Start astelin nose spray       Relevant Medications   azelastine (ASTELIN) 0.1 % nasal spray     Other   Right foot pain - Primary    Suspected plantar fasciitis Ordering right foot xray to r/o any other etiologies such as hairline fracture, oa, osteoporosis Stretch extremity  Ice to site , with iced water bottle Exercises printed out for pt  Wear ortho inserts daily F/u with podiatry tomorrow as scheduled      Relevant Orders   DG Foot Complete Right   DOE (dyspnea on exertion)    Lung sounds WNL  Suspect maybe allergy related and will also r/o anemia or other causes of fatigue with lab      Relevant Orders   B12 and Folate Panel   CBC with Differential   Basic metabolic panel   Other fatigue    Fatigue lab workup       Relevant Orders   B12 and Folate Panel   CBC with Differential   Basic metabolic panel   TSH    Meds ordered this encounter  Medications   azelastine (ASTELIN) 0.1 % nasal spray    Sig: Place 1 spray into both nostrils 2 (two) times daily. Use in each nostril as directed    Dispense:  9 mL    Refill:  2    Order Specific Question:   Supervising Provider    Answer:   BEDSOLE, AMY E [[id number]    Follow-up: Return if symptoms worsen or fail to improve.    Eugenia Pancoast, FNP

## 2021-12-11 NOTE — Assessment & Plan Note (Signed)
Suspected plantar fasciitis Ordering right foot xray to r/o any other etiologies such as hairline fracture, oa, osteoporosis Stretch extremity  Ice to site , with iced water bottle Exercises printed out for pt  Wear ortho inserts daily F/u with podiatry tomorrow as scheduled

## 2021-12-11 NOTE — Assessment & Plan Note (Signed)
Continue flonase 50 mcg  Start astelin nose spray

## 2021-12-12 ENCOUNTER — Encounter: Payer: Self-pay | Admitting: Family

## 2021-12-12 ENCOUNTER — Other Ambulatory Visit: Payer: Self-pay | Admitting: Family

## 2021-12-12 DIAGNOSIS — R739 Hyperglycemia, unspecified: Secondary | ICD-10-CM

## 2021-12-12 NOTE — Progress Notes (Signed)
Terri can we add a1c? Added order.  B12 lower end of normal recommend daily 500 mcg b12  No anemia Thyroid ok

## 2021-12-13 ENCOUNTER — Other Ambulatory Visit (INDEPENDENT_AMBULATORY_CARE_PROVIDER_SITE_OTHER): Payer: Medicare PPO

## 2021-12-13 DIAGNOSIS — R739 Hyperglycemia, unspecified: Secondary | ICD-10-CM

## 2021-12-13 LAB — HEMOGLOBIN A1C: Hgb A1c MFr Bld: 6.5 % (ref 4.6–6.5)

## 2021-12-14 NOTE — Progress Notes (Signed)
A1c in diabetic range Work on diet , watch carbs and excess sugar. We will just monitor.

## 2021-12-15 ENCOUNTER — Encounter: Payer: Self-pay | Admitting: Family

## 2021-12-18 ENCOUNTER — Other Ambulatory Visit: Payer: Self-pay | Admitting: Family

## 2021-12-18 DIAGNOSIS — F5101 Primary insomnia: Secondary | ICD-10-CM

## 2021-12-19 ENCOUNTER — Encounter: Payer: Self-pay | Admitting: Family

## 2021-12-19 DIAGNOSIS — E119 Type 2 diabetes mellitus without complications: Secondary | ICD-10-CM

## 2021-12-20 MED ORDER — BLOOD GLUCOSE METER KIT: PACK | 0 refills | Status: DC

## 2022-01-12 ENCOUNTER — Encounter: Payer: Self-pay | Admitting: Family

## 2022-01-15 ENCOUNTER — Other Ambulatory Visit: Payer: Self-pay | Admitting: Cardiovascular Disease

## 2022-01-17 ENCOUNTER — Other Ambulatory Visit: Payer: Self-pay | Admitting: Family

## 2022-01-17 DIAGNOSIS — F5101 Primary insomnia: Secondary | ICD-10-CM

## 2022-02-22 ENCOUNTER — Other Ambulatory Visit: Payer: Self-pay | Admitting: Family

## 2022-02-22 DIAGNOSIS — F5101 Primary insomnia: Secondary | ICD-10-CM

## 2022-03-20 ENCOUNTER — Telehealth: Payer: Self-pay | Admitting: Family

## 2022-03-20 NOTE — Telephone Encounter (Signed)
Left message for patient to call back and schedule Medicare Annual Wellness Visit (AWV).   Please offer to do virtually or by telephone.  Left office number and my jabber #[phone].  AWVI eligible as of  05/21/2009  Please schedule at anytime with Nurse Health Advisor.   

## 2022-03-22 NOTE — Progress Notes (Signed)
Cardiology Office Note  Date:  03/23/2022   ID:  Janice Bush, DOB 07-14-1931, MRN [id number]  PCP:  Janice Bush, [place]   Chief Complaint  Patient presents with   6 month follow up     "Doing well." Medications reviewed by the patient verbally.     HPI:  [name] is a 88 year old woman with past medical history of Anemia, on iron Scoliosis/sciatica Atrial fibrillation, likely permanent Hypertension, hyperlipidemia Who presents for follow-up of her atrial fibrillation  Last seen in clinic January 2023  Feels well, sleeping well on gabapentin/ativan Has med alert button for accidents  On Eliquis 2.5 twice daily reduced dose 60 kg age over 31, digoxin 0.125 daily  previously on metoprolol, felt she was having side effects, this was held  Lab work reviewed November 2022 HGB 10.5, remained low on repeat Started on iron  Labs in 3/23 HGB >13  No new complaints overall feels well  EKG personally reviewed by myself on todays visit Atrial fibrillation ventricular rate 67 bpm left bundle branch block No change from prior EKGs  Other past medical history reviewed EKG at [facility] March 30, 2021 showing atrial fibrillation rate 82 bpm left bundle branch block  Echocardiogram 2017 ejection fraction greater than 55% normal left atrial size  Digoxin level 1.1 in November 2022 Total cholesterol 209 LDL 122  Ill last week, went to urgent care "[facility]"  Prior PMD stopped metoprolol, unclear reasons She feels it was because she was on too many medications    PMH:   has a past medical history of Afib ([place]), Allergy, Arthritis, Cancer ([place]), Glaucoma, Heart murmur, Hyperlipidemia, Hypertension, Migraine without status migrainosus, not intractable (11/19/2012), Migraines, and Uterine cancer ([place]) (2012).  PSH:    Past Surgical History:  Procedure Laterality Date   BREAST LUMPECTOMY Left 1970   ROBOTIC ASSISTED LAP VAGINAL HYSTERECTOMY N/A    Full hysterectomy  per patient was not abdominal was robotic.   TONSILLECTOMY AND ADENOIDECTOMY      Current Outpatient Medications  Medication Sig Dispense Refill   amLODipine (NORVASC) 5 MG tablet Take 1 tablet by mouth daily.     apixaban (ELIQUIS) 2.5 MG TABS tablet Take 1 tablet (2.5 mg total) by mouth 2 (two) times daily. 60 tablet 11   ascorbic acid (VITAMIN C) 500 MG tablet Take by mouth.     blood glucose meter kit and supplies Dispense based on patient and insurance preference. Use up to four times daily as directed. (FOR ICD-10 E10.9, E11.9). 1 each 0   Cholecalciferol (VITAMIN D3 PO) Take by mouth.     clobetasol cream (TEMOVATE) 0.34 % APPLY 1 APPLICATION TOPICALLY 2 TIMES DAILY. 30 g 0   digoxin (LANOXIN) 0.125 MG tablet TAKE 1 TABLET BY MOUTH ONCE DAILY 90 tablet 0   ferrous sulfate 324 MG TBEC Take 324 mg by mouth.     fluticasone (FLONASE) 50 MCG/ACT nasal spray Place 2 sprays into both nostrils daily. 16 g 6   gabapentin (NEURONTIN) 100 MG capsule Take by mouth.     gabapentin (NEURONTIN) 300 MG capsule Take by mouth.     LORazepam (ATIVAN) 0.5 MG tablet TAKE 1 TABLET BY MOUTH AT BEDTIME AS NEEDED FOR SLEEP OR ANXIETY. 30 tablet 0   losartan (COZAAR) 25 MG tablet Take 1 tablet (25 mg total) by mouth in the morning and at bedtime.     timolol (TIMOPTIC-XR) 0.5 % ophthalmic gel-forming Administer 1 drop to both eyes Take as directed.  azelastine (ASTELIN) 0.1 % nasal spray Place 1 spray into both nostrils 2 (two) times daily. Use in each nostril as directed (Patient not taking: Reported on 03/23/2022) 9 mL 2   No current facility-administered medications for this visit.   Allergies:   Tramadol and Diphenhydramine hcl   Social History:  The patient  reports that she has never smoked. She has never used smokeless tobacco. She reports that she does not drink alcohol and does not use drugs.   Family History:   family history includes Alcohol abuse in her father; Diabetes in her brother; Early  death in her brother, brother, father, and mother; Heart attack (age of onset: 37) in her father; Heart attack (age of onset: 32) in her brother; Heart attack (age of onset: 58) in her brother; Hyperlipidemia in her brother; Hypertension in her brother and mother; Lung cancer in her mother; Other in her paternal grandfather.   Review of Systems: Review of Systems  Constitutional: Negative.   HENT: Negative.    Respiratory: Negative.    Cardiovascular: Negative.   Gastrointestinal: Negative.   Musculoskeletal: Negative.   Neurological: Negative.   Psychiatric/Behavioral: Negative.    All other systems reviewed and are negative.   PHYSICAL EXAM: VS:  BP 130/60 (BP Location: Left Arm, Patient Position: Sitting, Cuff Size: Normal)   Ht 5' 5.5" (1.664 m)   Wt 133 lb (60.3 kg)   SpO2 97%   BMI 21.80 kg/m  , BMI Body mass index is 21.8 kg/m. Constitutional:  oriented to person, place, and time. No distress.  HENT:  Head: Grossly normal Eyes:  no discharge. No scleral icterus.  Neck: No JVD, no carotid bruits  Cardiovascular: Irregularly irregular , no murmurs appreciated Pulmonary/Chest: Clear to auscultation bilaterally, no wheezes or rails Abdominal: Soft.  no distension.  no tenderness.  Musculoskeletal: Normal range of motion Neurological:  normal muscle tone. Coordination normal. No atrophy Skin: Skin warm and dry Psychiatric: normal affect, pleasant   Recent Labs: 12/11/2021: BUN 12; Creatinine, Ser 0.68; Hemoglobin 13.4; Platelets 252.0; Potassium 4.1; Sodium 140; TSH 1.93    Lipid Panel No results found for: "CHOL", "HDL", "LDLCALC", "TRIG"    Wt Readings from Last 3 Encounters:  03/23/22 133 lb (60.3 kg)  12/11/21 131 lb 8 oz (59.6 kg)  09/26/21 131 lb 5 oz (59.6 kg)     ASSESSMENT AND PLAN:  Problem List Items Addressed This Visit       Cardiology Problems   Essential hypertension   Hyperlipidemia   Paroxysmal atrial fibrillation (HCC) - Primary      Other   DOE (dyspnea on exertion)  Permanent atrial fibrillation On Eliquis 2.5 twice daily, digoxin for rate control Metoprolol previously held by outpatient primary care at [place] she have digoxin level, she prefers to have this done with primary care next week No recent falls, denies TIA or stroke symptoms  PVCs Asymptomatic Previously noted on EKG  Essential hypertension Blood pressure is well controlled on today's visit. No changes made to the medications.  Hyperlipidemia She is concerned about side effects of statins Recommend she try Zetia 10 mg daily after she has lab work next week    Total encounter time more than 30 minutes  Greater than 50% was spent in counseling and coordination of care with the patient    Signed, Esmond Plants, M.D., Ph.D. [place], [place]

## 2022-03-23 ENCOUNTER — Encounter: Payer: Self-pay | Admitting: Cardiovascular Disease

## 2022-03-23 ENCOUNTER — Ambulatory Visit: Payer: Medicare PPO | Attending: Cardiovascular Disease | Admitting: Cardiovascular Disease

## 2022-03-23 VITALS — BP 130/60 | HR 67 | Ht 65.5 in | Wt 133.0 lb

## 2022-03-23 DIAGNOSIS — I480 Paroxysmal atrial fibrillation: Secondary | ICD-10-CM | POA: Diagnosis not present

## 2022-03-23 DIAGNOSIS — R0609 Other forms of dyspnea: Secondary | ICD-10-CM | POA: Diagnosis not present

## 2022-03-23 DIAGNOSIS — I10 Essential (primary) hypertension: Secondary | ICD-10-CM

## 2022-03-23 DIAGNOSIS — E782 Mixed hyperlipidemia: Secondary | ICD-10-CM

## 2022-03-23 MED ORDER — DIGOXIN 125 MCG PO TABS: 125.0000 ug | ORAL_TABLET | Freq: Every day | ORAL | 3 refills | Status: DC

## 2022-03-23 MED ORDER — EZETIMIBE 10 MG PO TABS
10.0000 mg | ORAL_TABLET | Freq: Every day | ORAL | 3 refills | Status: DC
Start: 2022-03-23 — End: 2023-04-02

## 2022-03-23 NOTE — Patient Instructions (Addendum)
Medication Instructions:  Please start zetia 10 mg daily for cholesterol  If you need a refill on your cardiac medications before your next appointment, please call your pharmacy.   Lab work: No new labs needed  Testing/Procedures: No new testing needed  Follow-Up: At [facility], you and your health needs are our priority.  As part of our continuing mission to provide you with exceptional heart care, we have created designated Provider Care Teams.  These Care Teams include your primary Cardiologist (physician) and Advanced Practice Providers (APPs -  Physician Assistants and Nurse Practitioners) who all work together to provide you with the care you need, when you need it.  You will need a follow up appointment in 12 months  Providers on your designated Care Team:   Christopher Berge, NP Ryan Dunn, PA-C Cadence Furth, PA-C  COVID-19 Vaccine Information can be found at: [email or web address] For questions related to vaccine distribution or appointments, please email [email or web address] or call [phone].   

## 2022-03-26 ENCOUNTER — Ambulatory Visit (INDEPENDENT_AMBULATORY_CARE_PROVIDER_SITE_OTHER): Payer: Medicare PPO

## 2022-03-26 VITALS — Ht 65.5 in | Wt 133.0 lb

## 2022-03-26 DIAGNOSIS — Z0000 Encounter for general adult medical examination without abnormal findings: Secondary | ICD-10-CM

## 2022-03-26 NOTE — Patient Instructions (Signed)
[name] , Thank you for taking time to come for your Medicare Wellness Visit. I appreciate your ongoing commitment to your health goals. Please review the following plan we discussed and let me know if I can assist you in the future.   Screening recommendations/referrals: Colonoscopy: not required Mammogram: completed 10/17/2021, due 10/19/2022 Bone Density: due Recommended yearly ophthalmology/optometry visit for glaucoma screening and checkup Recommended yearly dental visit for hygiene and checkup  Vaccinations: Influenza vaccine: completed per patient Pneumococcal vaccine: completed 10/06/2013 Tdap vaccine: completed 03/20/2018, due 03/20/2028 Shingles vaccine: completed   Covid-19: 02/13/2021, 08/19/2020, 07/02/2019, 06/04/2019  Advanced directives: Please bring a copy of your POA (Power of Attorney) and/or Living Will to your next appointment.   Conditions/risks identified: none  Next appointment: Follow up in one year for your annual wellness visit    Preventive Care 65 Years and Older, Female Preventive care refers to lifestyle choices and visits with your health care provider that can promote health and wellness. What does preventive care include? A yearly physical exam. This is also called an annual well check. Dental exams once or twice a year. Routine eye exams. Ask your health care provider how often you should have your eyes checked. Personal lifestyle choices, including: Daily care of your teeth and gums. Regular physical activity. Eating a healthy diet. Avoiding tobacco and drug use. Limiting alcohol use. Practicing safe sex. Taking low-dose aspirin every day. Taking vitamin and mineral supplements as recommended by your health care provider. What happens during an annual well check? The services and screenings done by your health care provider during your annual well check will depend on your age, overall health, lifestyle risk factors, and family history of  disease. Counseling  Your health care provider may ask you questions about your: Alcohol use. Tobacco use. Drug use. Emotional well-being. Home and relationship well-being. Sexual activity. Eating habits. History of falls. Memory and ability to understand (cognition). Work and work Statistician. Reproductive health. Screening  You may have the following tests or measurements: Height, weight, and BMI. Blood pressure. Lipid and cholesterol levels. These may be checked every 5 years, or more frequently if you are over 41 years old. Skin check. Lung cancer screening. You may have this screening every year starting at age 39 if you have a 30-pack-year history of smoking and currently smoke or have quit within the past 15 years. Fecal occult blood test (FOBT) of the stool. You may have this test every year starting at age 13. Flexible sigmoidoscopy or colonoscopy. You may have a sigmoidoscopy every 5 years or a colonoscopy every 10 years starting at age 14. Hepatitis C blood test. Hepatitis B blood test. Sexually transmitted disease (STD) testing. Diabetes screening. This is done by checking your blood sugar (glucose) after you have not eaten for a while (fasting). You may have this done every 1-3 years. Bone density scan. This is done to screen for osteoporosis. You may have this done starting at age 35. Mammogram. This may be done every 1-2 years. Talk to your health care provider about how often you should have regular mammograms. Talk with your health care provider about your test results, treatment options, and if necessary, the need for more tests. Vaccines  Your health care provider may recommend certain vaccines, such as: Influenza vaccine. This is recommended every year. Tetanus, diphtheria, and acellular pertussis (Tdap, Td) vaccine. You may need a Td booster every 10 years. Zoster vaccine. You may need this after age 71. Pneumococcal 13-valent conjugate (PCV13)  vaccine. One  dose is recommended after age 34. Pneumococcal polysaccharide (PPSV23) vaccine. One dose is recommended after age 43. Talk to your health care provider about which screenings and vaccines you need and how often you need them. This information is not intended to replace advice given to you by your health care provider. Make sure you discuss any questions you have with your health care provider. Document Released: 06/03/2015 Document Revised: 01/25/2016 Document Reviewed: 03/08/2015 Elsevier Interactive Patient Education  2017 [place] Prevention in the Home Falls can cause injuries. They can happen to people of all ages. There are many things you can do to make your home safe and to help prevent falls. What can I do on the outside of my home? Regularly fix the edges of walkways and driveways and fix any cracks. Remove anything that might make you trip as you walk through a door, such as a raised step or threshold. Trim any bushes or trees on the path to your home. Use bright outdoor lighting. Clear any walking paths of anything that might make someone trip, such as rocks or tools. Regularly check to see if handrails are loose or broken. Make sure that both sides of any steps have handrails. Any raised decks and porches should have guardrails on the edges. Have any leaves, snow, or ice cleared regularly. Use sand or salt on walking paths during winter. Clean up any spills in your garage right away. This includes oil or grease spills. What can I do in the bathroom? Use night lights. Install grab bars by the toilet and in the tub and shower. Do not use towel bars as grab bars. Use non-skid mats or decals in the tub or shower. If you need to sit down in the shower, use a plastic, non-slip stool. Keep the floor dry. Clean up any water that spills on the floor as soon as it happens. Remove soap buildup in the tub or shower regularly. Attach bath mats securely with double-sided  non-slip rug tape. Do not have throw rugs and other things on the floor that can make you trip. What can I do in the bedroom? Use night lights. Make sure that you have a light by your bed that is easy to reach. Do not use any sheets or blankets that are too big for your bed. They should not hang down onto the floor. Have a firm chair that has side arms. You can use this for support while you get dressed. Do not have throw rugs and other things on the floor that can make you trip. What can I do in the kitchen? Clean up any spills right away. Avoid walking on wet floors. Keep items that you use a lot in easy-to-reach places. If you need to reach something above you, use a strong step stool that has a grab bar. Keep electrical cords out of the way. Do not use floor polish or wax that makes floors slippery. If you must use wax, use non-skid floor wax. Do not have throw rugs and other things on the floor that can make you trip. What can I do with my stairs? Do not leave any items on the stairs. Make sure that there are handrails on both sides of the stairs and use them. Fix handrails that are broken or loose. Make sure that handrails are as long as the stairways. Check any carpeting to make sure that it is firmly attached to the stairs. Fix any carpet that is loose  or worn. Avoid having throw rugs at the top or bottom of the stairs. If you do have throw rugs, attach them to the floor with carpet tape. Make sure that you have a light switch at the top of the stairs and the bottom of the stairs. If you do not have them, ask someone to add them for you. What else can I do to help prevent falls? Wear shoes that: Do not have high heels. Have rubber bottoms. Are comfortable and fit you well. Are closed at the toe. Do not wear sandals. If you use a stepladder: Make sure that it is fully opened. Do not climb a closed stepladder. Make sure that both sides of the stepladder are locked into place. Ask  someone to hold it for you, if possible. Clearly mark and make sure that you can see: Any grab bars or handrails. First and last steps. Where the edge of each step is. Use tools that help you move around (mobility aids) if they are needed. These include: Canes. Walkers. Scooters. Crutches. Turn on the lights when you go into a dark area. Replace any light bulbs as soon as they burn out. Set up your furniture so you have a clear path. Avoid moving your furniture around. If any of your floors are uneven, fix them. If there are any pets around you, be aware of where they are. Review your medicines with your doctor. Some medicines can make you feel dizzy. This can increase your chance of falling. Ask your doctor what other things that you can do to help prevent falls. This information is not intended to replace advice given to you by your health care provider. Make sure you discuss any questions you have with your health care provider. Document Released: 03/03/2009 Document Revised: 10/13/2015 Document Reviewed: 06/11/2014 Elsevier Interactive Patient Education  2017 [employer].

## 2022-03-26 NOTE — Progress Notes (Signed)
I connected with Janice Bush today by telephone and verified that I am speaking with the correct person using two identifiers. Location patient: home Location provider: work Persons participating in the virtual visit: Raetta Agostinelli, Glenna Durand LPN.   I discussed the limitations, risks, security and privacy concerns of performing an evaluation and management service by telephone and the availability of in person appointments. I also discussed with the patient that there may be a patient responsible charge related to this service. The patient expressed understanding and verbally consented to this telephonic visit.    Interactive audio and video telecommunications were attempted between this provider and patient, however failed, due to patient having technical difficulties OR patient did not have access to video capability.  We continued and completed visit with audio only.     Vital signs may be patient reported or missing.  Subjective:   Janice Bush is a 88 y.o. female who presents for an Initial Medicare Annual Wellness Visit.  Review of Systems     Cardiac Risk Factors include: advanced age (>57mn, >>66women);dyslipidemia;hypertension     Objective:    Today's Vitals   03/26/22 1532  Weight: 133 lb (60.3 kg)  Height: 5' 5.5" (1.664 m)   Body mass index is 21.8 kg/m.     03/26/2022    3:39 PM  Advanced Directives  Does Patient Have a Medical Advance Directive? Yes  Type of AParamedicof AIdalouLiving will  Copy of H[place]in Chart? No - copy requested    Current Medications (verified) Outpatient Encounter Medications as of 03/26/2022  Medication Sig   amLODipine (NORVASC) 5 MG tablet Take 1 tablet by mouth daily.   apixaban (ELIQUIS) 2.5 MG TABS tablet Take 1 tablet (2.5 mg total) by mouth 2 (two) times daily.   ascorbic acid (VITAMIN C) 500 MG tablet Take by mouth.   blood glucose meter kit and supplies  Dispense based on patient and insurance preference. Use up to four times daily as directed. (FOR ICD-10 E10.9, E11.9).   Cholecalciferol (VITAMIN D3 PO) Take by mouth.   clobetasol cream (TEMOVATE) 09.10% APPLY 1 APPLICATION TOPICALLY 2 TIMES DAILY.   digoxin (LANOXIN) 0.125 MG tablet Take 1 tablet (125 mcg total) by mouth daily.   fluticasone (FLONASE) 50 MCG/ACT nasal spray Place 2 sprays into both nostrils daily.   gabapentin (NEURONTIN) 100 MG capsule Take by mouth.   latanoprost (XALATAN) 0.005 % ophthalmic solution 1 drop at bedtime.   LORazepam (ATIVAN) 0.5 MG tablet TAKE 1 TABLET BY MOUTH AT BEDTIME AS NEEDED FOR SLEEP OR ANXIETY.   losartan (COZAAR) 25 MG tablet Take 1 tablet (25 mg total) by mouth in the morning and at bedtime.   timolol (TIMOPTIC-XR) 0.5 % ophthalmic gel-forming Administer 1 drop to both eyes Take as directed.   azelastine (ASTELIN) 0.1 % nasal spray Place 1 spray into both nostrils 2 (two) times daily. Use in each nostril as directed (Patient not taking: Reported on 03/23/2022)   ezetimibe (ZETIA) 10 MG tablet Take 1 tablet (10 mg total) by mouth daily. (Patient not taking: Reported on 03/26/2022)   ferrous sulfate 324 MG TBEC Take 324 mg by mouth. (Patient not taking: Reported on 03/26/2022)   gabapentin (NEURONTIN) 300 MG capsule Take by mouth.   No facility-administered encounter medications on file as of 03/26/2022.    Allergies (verified) Tramadol and Diphenhydramine hcl   History: Past Medical History:  Diagnosis Date   [place]  Allergy    Arthritis    Cancer ([place])    Glaucoma    Heart murmur    Hyperlipidemia    Hypertension    Migraine without status migrainosus, not intractable 11/19/2012   Migraines    Uterine cancer ([place]) 2012   Past Surgical History:  Procedure Laterality Date   BREAST LUMPECTOMY Left 1970   ROBOTIC ASSISTED LAP VAGINAL HYSTERECTOMY N/A    Full hysterectomy per patient was not abdominal was robotic.   TONSILLECTOMY  AND ADENOIDECTOMY     Family History  Problem Relation Age of Onset   Hypertension Mother    Early death Mother    Lung cancer Mother        smoker   Early death Father    Alcohol abuse Father    Heart attack Father 67   Hypertension Brother    Hyperlipidemia Brother    Heart attack Brother 46   Early death Brother    Diabetes Brother    Heart attack Brother 29   Early death Brother    Other Paternal Grandfather        guillan barre syndrome   Social History   Socioeconomic History   Marital status: Widowed    Spouse name: Not on file   Number of children: Not on file   Years of education: Not on file   Highest education level: Not on file  Occupational History   Not on file  Tobacco Use   Smoking status: Never   Smokeless tobacco: Never  Vaping Use   Vaping Use: Never used  Substance and Sexual Activity   Alcohol use: Never   Drug use: Never   Sexual activity: Not Currently  Other Topics Concern   Not on file  Social History Narrative   Not on file   Social Determinants of Health   Financial Resource Strain: Low Risk  (03/26/2022)   Overall Financial Resource Strain (CARDIA)    Difficulty of Paying Living Expenses: Not hard at all  Food Insecurity: No Food Insecurity (03/26/2022)   Hunger Vital Sign    Worried About Running Out of Food in the Last Year: Never true    Ran Out of Food in the Last Year: Never true  Transportation Needs: No Transportation Needs (03/26/2022)   PRAPARE - Hydrologist (Medical): No    Lack of Transportation (Non-Medical): No  Physical Activity: Insufficiently Active (03/26/2022)   Exercise Vital Sign    Days of Exercise per Week: 2 days    Minutes of Exercise per Session: 30 min  Stress: No Stress Concern Present (03/26/2022)   [place]    Feeling of Stress : Not at all  Social Connections: Not on file    Tobacco  Counseling Counseling given: Not Answered   Clinical Intake:  Pre-visit preparation completed: Yes  Pain : No/denies pain     Nutritional Status: BMI of 19-24  Normal Nutritional Risks: None Diabetes: No  How often do you need to have someone help you when you read instructions, pamphlets, or other written materials from your doctor or pharmacy?: 1 - Never What is the last grade level you completed in school?: masters degree  Diabetic? no  Interpreter Needed?: No  Information entered by :: NAllen LPN   Activities of Daily Living    03/26/2022    3:41 PM 03/24/2022    6:42 PM  In your present state of health, do  you have any difficulty performing the following activities:  Hearing? 0 0  Comment wears hearing aids   Vision? 0 0  Difficulty concentrating or making decisions? 0 0  Walking or climbing stairs? 0 0  Dressing or bathing? 0 0  Doing errands, shopping? 0 0  Preparing Food and eating ? N N  Using the Toilet? N N  In the past six months, have you accidently leaked urine? Y Y  Do you have problems with loss of bowel control? N N  Managing your Medications? N N  Managing your Finances? N N  Housekeeping or managing your Housekeeping? N N    Patient Care Team: Eugenia Pancoast, FNP as PCP - General (Family Medicine) Mora Bellman, MD as Consulting Physician (Obstetrics and Gynecology) Minna Merritts, MD as Consulting Physician (Cardiology)  Indicate any recent Medical Services you may have received from other than [facility] providers in the past year (date may be approximate).     Assessment:   This is a routine wellness examination for [place].  Hearing/Vision screen Vision Screening - Comments:: Regular eye exams, [facility]  Dietary issues and exercise activities discussed: Current Exercise Habits: Home exercise routine, Type of exercise: walking, Time (Minutes): 30, Frequency (Times/Week): 2, Weekly Exercise (Minutes/Week): 60   Goals  Addressed             This Visit's Progress    Patient Stated       03/26/2022, working on stamina       Depression Screen    03/26/2022    3:41 PM 08/03/2021   11:46 AM  PHQ 2/9 Scores  PHQ - 2 Score 0 0    Fall Risk    03/26/2022    3:40 PM 03/24/2022    6:42 PM 08/03/2021   11:46 AM  [place] in the past year? 0 0 0  Number falls in past yr: 0 0   Injury with Fall? 0    Risk for fall due to : Medication side effect  No Fall Risks  Follow up Falls prevention discussed;Education provided;Falls evaluation completed  Falls evaluation completed    FALL RISK PREVENTION PERTAINING TO THE HOME:  Any stairs in or around the home? Yes  If so, are there any without handrails? No  Home free of loose throw rugs in walkways, pet beds, electrical cords, etc? Yes  Adequate lighting in your home to reduce risk of falls? Yes   ASSISTIVE DEVICES UTILIZED TO PREVENT FALLS:  Life alert? Yes  Use of a cane, walker or w/c? No  Grab bars in the bathroom? Yes  Shower chair or bench in shower? Yes  Elevated toilet seat or a handicapped toilet? Yes   TIMED UP AND GO:  Was the test performed? No .      Cognitive Function:        03/26/2022    3:42 PM  6CIT Screen  What Year? 0 points  What month? 0 points  What time? 0 points  Count back from 20 0 points  Months in reverse 0 points  Repeat phrase 0 points  Total Score 0 points    Immunizations Immunization History  Administered Date(s) Administered   Influenza, High Dose Seasonal PF 04/04/2021   Influenza, Seasonal, Injecte, Preservative Fre 02/03/2010   Influenza,inj,Quad PF,6+ Mos 03/21/2012, 04/06/2014, 02/18/2015, 02/14/2016, 02/12/2017, 03/20/2018, 02/26/2019, 02/25/2020   Influenza-Unspecified 04/24/1999, 04/27/2002, 03/27/2005, 05/31/2008, 03/06/2013   Moderna Covid-19 Vaccine Bivalent Booster 28yr & up 02/13/2021  Moderna Sars-Covid-2 Vaccination 06/04/2019, 07/02/2019, 08/19/2020   Pneumococcal  Conjugate-13 10/06/2013   Pneumococcal Polysaccharide-23 04/24/1999   Td 01/02/2002, 11/26/2007   Tdap 11/26/2007, 03/20/2018   Zoster Recombinat (Shingrix) 02/06/2017, 04/25/2017   Zoster, Live 09/30/2007    TDAP status: Up to date  Flu Vaccine status: Up to date  Pneumococcal vaccine status: Up to date  Covid-19 vaccine status: Completed vaccines  Qualifies for Shingles Vaccine? Yes   Zostavax completed Yes   Shingrix Completed?: Yes  Screening Tests Health Maintenance  Topic Date Due   Medicare Annual Wellness (AWV)  Never done   COVID-19 Vaccine (5 - Moderna risk series) 04/10/2021   INFLUENZA VACCINE  12/19/2021   DEXA SCAN  08/09/2022 (Originally 04/05/1999)   MAMMOGRAM  10/18/2022   TETANUS/TDAP  03/20/2028   Pneumonia Vaccine 77+ Years old  Completed   Zoster Vaccines- Shingrix  Completed   HPV VACCINES  Aged Out    Health Maintenance  Health Maintenance Due  Topic Date Due   Medicare Annual Wellness (AWV)  Never done   COVID-19 Vaccine (5 - Moderna risk series) 04/10/2021   INFLUENZA VACCINE  12/19/2021    Colorectal cancer screening: No longer required.   Mammogram status: Completed 10/17/2021. Repeat every year  Bone Density status: due  Lung Cancer Screening: (Low Dose CT Chest recommended if Age 22-80 years, 30 pack-year currently smoking OR have quit w/in 15years.) does not qualify.   Lung Cancer Screening Referral: no  Additional Screening:  Hepatitis C Screening: does not qualify;  Vision Screening: Recommended annual ophthalmology exams for early detection of glaucoma and other disorders of the eye. Is the patient up to date with their annual eye exam?  Yes  Who is the provider or what is the name of the office in which the patient attends annual eye exams? [facility] If pt is not established with a provider, would they like to be referred to a provider to establish care? No .   Dental Screening: Recommended annual dental exams  for proper oral hygiene  Community Resource Referral / Chronic Care Management: CRR required this visit?  No   CCM required this visit?  No      Plan:     I have personally reviewed and noted the following in the patient's chart:   Medical and social history Use of alcohol, tobacco or illicit drugs  Current medications and supplements including opioid prescriptions. Patient is not currently taking opioid prescriptions. Functional ability and status Nutritional status Physical activity Advanced directives List of other physicians Hospitalizations, surgeries, and ER visits in previous 12 months Vitals Screenings to include cognitive, depression, and falls Referrals and appointments  In addition, I have reviewed and discussed with patient certain preventive protocols, quality metrics, and best practice recommendations. A written personalized care plan for preventive services as well as general preventive health recommendations were provided to patient.     Kellie Simmering, LPN   97/08/1636   Nurse Notes: none  Due to this being a virtual visit, the after visit summary with patients personalized plan was offered to patient via mail or my-chart.  Patient would like to access on my-chart

## 2022-03-28 ENCOUNTER — Encounter: Payer: Self-pay | Admitting: Family

## 2022-03-29 ENCOUNTER — Ambulatory Visit: Payer: Medicare PPO | Admitting: Family

## 2022-03-29 ENCOUNTER — Telehealth: Payer: Self-pay | Admitting: Family

## 2022-03-29 ENCOUNTER — Encounter: Payer: Self-pay | Admitting: Family

## 2022-03-29 VITALS — BP 132/72 | HR 60 | Temp 97.9°F | Resp 16 | Ht 65.5 in | Wt 134.2 lb

## 2022-03-29 DIAGNOSIS — Z5181 Encounter for therapeutic drug level monitoring: Secondary | ICD-10-CM | POA: Diagnosis not present

## 2022-03-29 DIAGNOSIS — G8929 Other chronic pain: Secondary | ICD-10-CM

## 2022-03-29 DIAGNOSIS — M545 Low back pain, unspecified: Secondary | ICD-10-CM

## 2022-03-29 DIAGNOSIS — E119 Type 2 diabetes mellitus without complications: Secondary | ICD-10-CM | POA: Diagnosis not present

## 2022-03-29 DIAGNOSIS — E782 Mixed hyperlipidemia: Secondary | ICD-10-CM | POA: Diagnosis not present

## 2022-03-29 DIAGNOSIS — Z79899 Other long term (current) drug therapy: Secondary | ICD-10-CM | POA: Diagnosis not present

## 2022-03-29 DIAGNOSIS — I480 Paroxysmal atrial fibrillation: Secondary | ICD-10-CM

## 2022-03-29 DIAGNOSIS — F5101 Primary insomnia: Secondary | ICD-10-CM

## 2022-03-29 DIAGNOSIS — R21 Rash and other nonspecific skin eruption: Secondary | ICD-10-CM

## 2022-03-29 LAB — LIPID PANEL
Cholesterol: 242 mg/dL — ABNORMAL HIGH (ref 0–200)
HDL: 45.9 mg/dL (ref >39.00)
LDL Cholesterol: 160 mg/dL — ABNORMAL HIGH (ref 0–99)
NonHDL: 196.51
Total CHOL/HDL Ratio: 5
Triglycerides: 184 mg/dL — ABNORMAL HIGH (ref 0.0–149.0)
VLDL: 36.8 mg/dL (ref 0.0–40.0)

## 2022-03-29 LAB — HEMOGLOBIN A1C: Hgb A1c MFr Bld: 6.6 % — ABNORMAL HIGH (ref 4.6–6.5)

## 2022-03-29 LAB — BASIC METABOLIC PANEL
BUN: 14 mg/dL (ref 6–23)
CO2: 30 mEq/L (ref 19–32)
Calcium: 9.4 mg/dL (ref 8.4–10.5)
Chloride: 101 mEq/L (ref 96–112)
Creatinine, Ser: 0.62 mg/dL (ref 0.40–1.20)
GFR: 79.75 mL/min (ref >60.00)
Glucose, Bld: 161 mg/dL — ABNORMAL HIGH (ref 70–99)
Potassium: 3.9 mEq/L (ref 3.5–5.1)
Sodium: 137 mEq/L (ref 135–145)

## 2022-03-29 MED ORDER — CEPHALEXIN 500 MG PO CAPS: 500.0000 mg | ORAL_CAPSULE | Freq: Two times a day (BID) | ORAL | 0 refills | Status: DC

## 2022-03-29 MED ORDER — GABAPENTIN 300 MG PO CAPS: 300.0000 mg | ORAL_CAPSULE | Freq: Every day | ORAL | 1 refills | Status: DC

## 2022-03-29 MED ORDER — TRIAMCINOLONE ACETONIDE 0.1 % EX CREA: 1.0000 | TOPICAL_CREAM | Freq: Two times a day (BID) | CUTANEOUS | 0 refills | Status: DC

## 2022-03-29 MED ORDER — LORAZEPAM 0.5 MG PO TABS: ORAL_TABLET | ORAL | 5 refills | Status: DC

## 2022-03-29 NOTE — Progress Notes (Signed)
CC you on her cholesterol, she was non fasting.

## 2022-03-29 NOTE — Telephone Encounter (Signed)
Patient called in returning a call she received.  

## 2022-03-29 NOTE — Progress Notes (Signed)
Established Patient Office Visit  Subjective:  Patient ID: Janice Bush, female    DOB: [birth year]  Age: 88 y.o. MRN: [id number]  CC:  Chief Complaint  Patient presents with   Diabetes   Anemia    HPI Janice Bush is here today for follow up.   Pt is with acute concerns.  DM2: since her last visit she has started checking her glucose. Fasting is around average 127. Used to eat candy, but has decreased this a lot in the last few months since seeing she might be diabetic. She eats pretty well rounded meals where she lives, but they do serve desserts, and since she is opting more for no sugar added when she orders. She does walk twice a day around the area in her neighborhood. She has noticed she is running lower with energy over the last one year, and she has to take more rest periods then she used to have to.  Lab Results  Component Value Date   HGBA1C 6.6 (H) 03/29/2022   Last seen by cardiology, on digoxin 125 mcg.   Tapered off of her gabapentin, had not been taking for several months, and then started back on it about one month ago. She is taking 300 mg.   Past Medical History:  Diagnosis Date   Afib ([place])    Allergy    Arthritis    Cancer ([place])    Glaucoma    Heart murmur    Hyperlipidemia    Hypertension    Migraine without status migrainosus, not intractable 11/19/2012   Migraines    Uterine cancer ([place]) 2012    Past Surgical History:  Procedure Laterality Date   BREAST LUMPECTOMY Left 1970   ROBOTIC ASSISTED LAP VAGINAL HYSTERECTOMY N/A    Full hysterectomy per patient was not abdominal was robotic.   TONSILLECTOMY AND ADENOIDECTOMY      Family History  Problem Relation Age of Onset   Hypertension Mother    Early death Mother    Lung cancer Mother        smoker   Early death Father    Alcohol abuse Father    Heart attack Father 38   Hypertension Brother    Hyperlipidemia Brother    Heart attack Brother 62   Early death Brother     Diabetes Brother    Heart attack Brother 68   Early death Brother    Other Paternal Grandfather        guillan barre syndrome    Social History   Socioeconomic History   Marital status: Widowed    Spouse name: Not on file   Number of children: Not on file   Years of education: Not on file   Highest education level: Not on file  Occupational History   Not on file  Tobacco Use   Smoking status: Never   Smokeless tobacco: Never  Vaping Use   Vaping Use: Never used  Substance and Sexual Activity   Alcohol use: Never   Drug use: Never   Sexual activity: Not Currently  Other Topics Concern   Not on file  Social History Narrative   Not on file   Social Determinants of Health   Financial Resource Strain: Low Risk  (03/26/2022)   Overall Financial Resource Strain (CARDIA)    Difficulty of Paying Living Expenses: Not hard at all  Food Insecurity: No Food Insecurity (03/26/2022)   Hunger Vital Sign    Worried About Running Out of Food in the  Last Year: Never true    [place] in the Last Year: Never true  Transportation Needs: No Transportation Needs (03/26/2022)   PRAPARE - Hydrologist (Medical): No    Lack of Transportation (Non-Medical): No  Physical Activity: Insufficiently Active (03/26/2022)   Exercise Vital Sign    Days of Exercise per Week: 2 days    Minutes of Exercise per Session: 30 min  Stress: No Stress Concern Present (03/26/2022)   [place]    Feeling of Stress : Not at all  Social Connections: Not on file  Intimate Partner Violence: Not on file    Outpatient Medications Prior to Visit  Medication Sig Dispense Refill   amLODipine (NORVASC) 5 MG tablet Take 1 tablet by mouth daily.     apixaban (ELIQUIS) 2.5 MG TABS tablet Take 1 tablet (2.5 mg total) by mouth 2 (two) times daily. 60 tablet 11   ascorbic acid (VITAMIN C) 500 MG tablet Take by mouth.      blood glucose meter kit and supplies Dispense based on patient and insurance preference. Use up to four times daily as directed. (FOR ICD-10 E10.9, E11.9). 1 each 0   Cholecalciferol (VITAMIN D3 PO) Take by mouth.     clobetasol cream (TEMOVATE) 0.37 % APPLY 1 APPLICATION TOPICALLY 2 TIMES DAILY. 30 g 0   digoxin (LANOXIN) 0.125 MG tablet Take 1 tablet (125 mcg total) by mouth daily. 90 tablet 3   ezetimibe (ZETIA) 10 MG tablet Take 1 tablet (10 mg total) by mouth daily. 90 tablet 3   ferrous sulfate 324 MG TBEC Take 324 mg by mouth.     fluticasone (FLONASE) 50 MCG/ACT nasal spray Place 2 sprays into both nostrils daily. 16 g 6   latanoprost (XALATAN) 0.005 % ophthalmic solution 1 drop at bedtime.     losartan (COZAAR) 25 MG tablet Take 1 tablet (25 mg total) by mouth in the morning and at bedtime.     timolol (TIMOPTIC-XR) 0.5 % ophthalmic gel-forming Administer 1 drop to both eyes Take as directed.     gabapentin (NEURONTIN) 100 MG capsule Take by mouth.     LORazepam (ATIVAN) 0.5 MG tablet TAKE 1 TABLET BY MOUTH AT BEDTIME AS NEEDED FOR SLEEP OR ANXIETY. 30 tablet 0   azelastine (ASTELIN) 0.1 % nasal spray Place 1 spray into both nostrils 2 (two) times daily. Use in each nostril as directed 9 mL 2   gabapentin (NEURONTIN) 300 MG capsule Take by mouth.     No facility-administered medications prior to visit.    Allergies  Allergen Reactions   Tramadol Other (See Comments)    Felt groggy and confused for a few days after taking one dose.   Diphenhydramine Hcl Anxiety         Objective:    Physical Exam Constitutional:      Appearance: Normal appearance.  Cardiovascular:     Rate and Rhythm: Normal rate.  Pulmonary:     Effort: Pulmonary effort is normal.  Musculoskeletal:     Comments: Bil red rash from base of feet to mid shin   Neurological:     General: No focal deficit present.     Mental Status: She is alert and oriented to person, place, and time. Mental status is at  baseline.  Psychiatric:        Mood and Affect: Mood normal.        Behavior:  Behavior normal.        Thought Content: Thought content normal.        Judgment: Judgment normal.      BP 132/72   Pulse 60   Temp 97.9 F (36.6 C)   Resp 16   Ht 5' 5.5" (1.664 m)   Wt 134 lb 4 oz (60.9 kg)   SpO2 97%   BMI 22.00 kg/m  Wt Readings from Last 3 Encounters:  03/29/22 134 lb 4 oz (60.9 kg)  03/26/22 133 lb (60.3 kg)  03/23/22 133 lb (60.3 kg)     There are no preventive care reminders to display for this patient.   There are no preventive care reminders to display for this patient.  Lab Results  Component Value Date   TSH 1.93 12/11/2021   Lab Results  Component Value Date   WBC 6.5 12/11/2021   HGB 13.4 12/11/2021   HCT 40.3 12/11/2021   MCV 90.0 12/11/2021   PLT 252.0 12/11/2021   Lab Results  Component Value Date   NA 137 03/29/2022   K 3.9 03/29/2022   CO2 30 03/29/2022   GLUCOSE 161 (H) 03/29/2022   BUN 14 03/29/2022   CREATININE 0.62 03/29/2022   CALCIUM 9.4 03/29/2022   GFR 79.75 03/29/2022   Lab Results  Component Value Date   CHOL 242 (H) 03/29/2022   Lab Results  Component Value Date   HDL 45.90 03/29/2022   Lab Results  Component Value Date   LDLCALC 160 (H) 03/29/2022   Lab Results  Component Value Date   TRIG 184.0 (H) 03/29/2022   Lab Results  Component Value Date   CHOLHDL 5 03/29/2022   Lab Results  Component Value Date   HGBA1C 6.6 (H) 03/29/2022      Assessment & Plan:   Problem List Items Addressed This Visit       Cardiovascular and Mediastinum   Paroxysmal atrial fibrillation ([place])    Ordering digoxin level pending results, will send to cardiology.         Musculoskeletal and Integument   Skin rash    Rx triamincinolone cream  Suspected cellulitis vs reaction contact dermatitis to leggings however pt declines cephalexin, sending cream instead. Pt to let me know if no improvement.      Relevant Medications    triamcinolone cream (KENALOG) 0.1 %     Other   Chronic lumbosacral pain    Refill gabapentin 300 mg       Relevant Medications   gabapentin (NEURONTIN) 300 MG capsule   Insomnia    Pdmp reviewed.  Refill ativan 0.5 mg       Relevant Medications   LORazepam (ATIVAN) 0.5 MG tablet   Hyperlipidemia    Ordered lipid panel, pending results. Work on low cholesterol diet and exercise as tolerated       Relevant Orders   Lipid panel (Completed)   Other Visit Diagnoses     Encounter for monitoring digoxin therapy    -  Primary   Relevant Orders   Digoxin level   Basic metabolic panel (Completed)   Controlled type 2 diabetes mellitus without complication, without long-term current use of insulin (HCC)       Relevant Orders   Hemoglobin A1c (Completed)   Basic metabolic panel (Completed)       Meds ordered this encounter  Medications   gabapentin (NEURONTIN) 300 MG capsule    Sig: Take 1 capsule (300 mg total) by mouth daily.  Dispense:  90 capsule    Refill:  1    Order Specific Question:   Supervising Provider    Answer:   BEDSOLE, AMY E [[id number]]   LORazepam (ATIVAN) 0.5 MG tablet    Sig: TAKE 1 TABLET BY MOUTH AT BEDTIME AS NEEDED FOR SLEEP OR ANXIETY.    Dispense:  30 tablet    Refill:  5    NEED REFILL    Order Specific Question:   Supervising Provider    Answer:   BEDSOLE, AMY E [[id number]]   DISCONTD: cephALEXin (KEFLEX) 500 MG capsule    Sig: Take 1 capsule (500 mg total) by mouth 2 (two) times daily for 5 days.    Dispense:  10 capsule    Refill:  0    Order Specific Question:   Supervising Provider    Answer:   BEDSOLE, AMY E [[id number]]   triamcinolone cream (KENALOG) 0.1 %    Sig: Apply 1 Application topically 2 (two) times daily.    Dispense:  30 g    Refill:  0    Order Specific Question:   Supervising Provider    Answer:   Diona Browner, AMY E [[id number]]    Follow-up: Return in about 6 months (around 09/27/2022) for f/u diabetes.    Eugenia Pancoast, FNP

## 2022-03-29 NOTE — Telephone Encounter (Signed)
Updated record.

## 2022-03-29 NOTE — Patient Instructions (Addendum)
Recommend taking cephalexin for both lower legs with red rash. Sent to pharmacy.   Regards,   Eugenia Pancoast FNP-C

## 2022-03-30 NOTE — Telephone Encounter (Signed)
Unable to reach patient. Left voicemail to return call to our office.   

## 2022-03-30 NOTE — Telephone Encounter (Signed)
Patient returned phone call. There was no message left on her phone of who called from our office. I advised her that the only thing I see in her chart is her lab results were just put in yesterday, so maybe Tabitha tried calling. She has reviewed her results on MyChart and has no further questions at this time.

## 2022-04-06 ENCOUNTER — Encounter: Payer: Self-pay | Admitting: Family

## 2022-04-06 DIAGNOSIS — R21 Rash and other nonspecific skin eruption: Secondary | ICD-10-CM | POA: Insufficient documentation

## 2022-04-06 NOTE — Telephone Encounter (Signed)
This was placed in future

## 2022-04-06 NOTE — Assessment & Plan Note (Signed)
Ordered lipid panel, pending results. Work on low cholesterol diet and exercise as tolerated ? ?

## 2022-04-06 NOTE — Telephone Encounter (Signed)
Patient called in and scheduled for lab only appointment. She will also like to have Hemoglobin drawn too. She will be coming in on Monday, 04/09/2022. Thank you!

## 2022-04-06 NOTE — Telephone Encounter (Signed)
Can we please call pt to ask that she come in to get digoxin drawn? Was ordered however wasn't visible during lab drawn so was not drawn.

## 2022-04-06 NOTE — Assessment & Plan Note (Signed)
Refill gabapentin 300 mg

## 2022-04-06 NOTE — Telephone Encounter (Signed)
Looks like this was not drawn

## 2022-04-06 NOTE — Assessment & Plan Note (Addendum)
Rx triamincinolone cream  Suspected cellulitis vs reaction contact dermatitis to leggings however pt declines cephalexin, sending cream instead. Pt to let me know if no improvement.

## 2022-04-06 NOTE — Telephone Encounter (Signed)
FYI

## 2022-04-06 NOTE — Assessment & Plan Note (Signed)
Pdmp reviewed.  Refill ativan 0.5 mg

## 2022-04-06 NOTE — Assessment & Plan Note (Signed)
Ordering digoxin level pending results, will send to cardiology.

## 2022-04-06 NOTE — Telephone Encounter (Signed)
Left message to return call to our office.  

## 2022-04-09 ENCOUNTER — Other Ambulatory Visit: Payer: Self-pay | Admitting: Family

## 2022-04-09 ENCOUNTER — Other Ambulatory Visit (INDEPENDENT_AMBULATORY_CARE_PROVIDER_SITE_OTHER): Payer: Medicare PPO

## 2022-04-09 DIAGNOSIS — J301 Allergic rhinitis due to pollen: Secondary | ICD-10-CM

## 2022-04-09 DIAGNOSIS — D509 Iron deficiency anemia, unspecified: Secondary | ICD-10-CM

## 2022-04-09 DIAGNOSIS — Z5181 Encounter for therapeutic drug level monitoring: Secondary | ICD-10-CM

## 2022-04-09 NOTE — Telephone Encounter (Signed)
Noted  

## 2022-04-10 LAB — CBC
HCT: 41.7 % (ref 36.0–46.0)
Hemoglobin: 13.9 g/dL (ref 12.0–15.0)
MCHC: 33.3 g/dL (ref 30.0–36.0)
MCV: 89.8 fl (ref 78.0–100.0)
Platelets: 295 10*3/uL (ref 150.0–400.0)
RBC: 4.64 Mil/uL (ref 3.87–5.11)
RDW: 13 % (ref 11.5–15.5)
WBC: 7.7 10*3/uL (ref 4.0–10.5)

## 2022-04-10 LAB — DIGOXIN LEVEL: Digoxin Level: <0.5 mcg/L — ABNORMAL LOW (ref 0.8–2.0)

## 2022-04-10 NOTE — Progress Notes (Signed)
Ordered digoxin per your notes and pt request.

## 2022-07-09 ENCOUNTER — Encounter: Payer: Self-pay | Admitting: Family

## 2022-07-09 ENCOUNTER — Ambulatory Visit
Admission: EM | Admit: 2022-07-09 | Discharge: 2022-07-09 | Disposition: A | Discharge status: Home / Self Care | Payer: Medicare PPO | Attending: Emergency Medicine | Admitting: Emergency Medicine

## 2022-07-09 ENCOUNTER — Telehealth: Payer: Self-pay | Admitting: Family

## 2022-07-09 DIAGNOSIS — R300 Dysuria: Secondary | ICD-10-CM

## 2022-07-09 LAB — POCT URINALYSIS DIP (MANUAL ENTRY)
Bilirubin, UA: NEGATIVE
Glucose, UA: NEGATIVE mg/dL
Ketones, POC UA: NEGATIVE mg/dL
Nitrite, UA: NEGATIVE
Protein Ur, POC: NEGATIVE mg/dL
Spec Grav, UA: <=1.005 — AB (ref 1.010–1.025)
Urobilinogen, UA: 0.2 E.U./dL
pH, UA: 6.5 (ref 5.0–8.0)

## 2022-07-09 MED ORDER — CEPHALEXIN 500 MG PO CAPS: 500.0000 mg | ORAL_CAPSULE | Freq: Two times a day (BID) | ORAL | 0 refills | Status: AC

## 2022-07-09 NOTE — Telephone Encounter (Signed)
Spoke with the patient and advised that she would have to come into the office to have another urinalysis done and we would send it out for a culture. First available appt is not until tomorrow 07/10/22 and patient does not want to wait to be treated. She will go to an urgent care to be seen today.

## 2022-07-09 NOTE — Telephone Encounter (Signed)
Patient called and said that she too an OTC test for UTI and said that she wants an antibiotic sent in for it. I told her she might need an appointment and she said she doesn't want to wait for an appt and asked for the nurse to call her back at [phone].Please advise

## 2022-07-09 NOTE — ED Triage Notes (Addendum)
Patient to Urgent Care with complaints of urinary frequency/ dysuria that started last night.  Reports using a home urine dipstick test today that was "strongly positive for leukocytes and nitrites". Has been pushing fluids.   Reports she has had some vaginal irritation and has been applying hydrocortisone cream. States that she has a continuous problem with this but it has worsened.

## 2022-07-09 NOTE — ED Provider Notes (Signed)
Roderic Palau    CSN: [id number] Arrival date & time: 07/09/22  1526      History   Chief Complaint Chief Complaint  Patient presents with   Urinary Frequency    HPI Janice Bush is a 89 y.o. female.  Patient presents with dysuria and urinary frequency since last night.  At home test positive for leukocytes and nitrite.  Treating with increased water intake.  Patient also reports chronic vaginal irritation but no vaginal discharge or pelvic pain.  She denies fever, chills, abdominal pain, flank pain, hematuria, or other symptoms.  Her medical history includes hypertension, atrial fibrillation, uterine cancer, migraine, vertigo.   The history is provided by the patient and medical records.    Past Medical History:  Diagnosis Date   Afib ([place])    Allergy    Arthritis    Cancer ([place])    Glaucoma    Heart murmur    Hyperlipidemia    Hypertension    Migraine without status migrainosus, not intractable 11/19/2012   Migraines    Uterine cancer ([place]) 2012    Patient Active Problem List   Diagnosis Date Noted   Skin rash 04/06/2022   DOE (dyspnea on exertion) 12/11/2021   Other fatigue 12/11/2021   Ocular migraine 09/26/2021   Serum sodium elevated 09/26/2021   Glaucoma of both eyes 09/26/2021   Iron deficiency anemia 09/26/2021   Allergic rhinitis due to pollen 09/26/2021   History of endometrial cancer 09/26/2021   Atrophic vaginitis 09/26/2021   Chronic lumbosacral pain 02/14/2016   BPPV (benign paroxysmal positional vertigo) 11/19/2012   Insomnia 11/19/2012   Hyperlipidemia 11/19/2012   Essential hypertension 04/11/2001   Osteoarthrosis 04/11/2001   Paroxysmal atrial fibrillation ([place]) 04/11/2001    Past Surgical History:  Procedure Laterality Date   BREAST LUMPECTOMY Left 1970   ROBOTIC ASSISTED LAP VAGINAL HYSTERECTOMY N/A    Full hysterectomy per patient was not abdominal was robotic.   TONSILLECTOMY AND ADENOIDECTOMY      OB History   No  obstetric history on file.      Home Medications    Prior to Admission medications   Medication Sig Start Date End Date Taking? Authorizing Provider  cephALEXin (KEFLEX) 500 MG capsule Take 1 capsule (500 mg total) by mouth 2 (two) times daily for 5 days. 07/09/22 07/14/22 Yes Sharion Balloon, NP  amLODipine (NORVASC) 5 MG tablet TAKE 1 TABLET BY MOUTH DAILY 04/10/22   Eugenia Pancoast, FNP  apixaban (ELIQUIS) 2.5 MG TABS tablet Take 1 tablet (2.5 mg total) by mouth 2 (two) times daily. 05/30/21   Minna Merritts, MD  ascorbic acid (VITAMIN C) 500 MG tablet Take by mouth.    [provider]  blood glucose meter kit and supplies Dispense based on patient and insurance preference. Use up to four times daily as directed. (FOR ICD-10 E10.9, E11.9). 12/20/21   Eugenia Pancoast, FNP  Cholecalciferol (VITAMIN D3 PO) Take by mouth.    [provider]  clobetasol cream (TEMOVATE) [id number] % APPLY 1 APPLICATION TOPICALLY 2 TIMES DAILY. 11/13/21   Eugenia Pancoast, FNP  digoxin (LANOXIN) 0.125 MG tablet Take 1 tablet (125 mcg total) by mouth daily. 03/23/22   Minna Merritts, MD  ezetimibe (ZETIA) 10 MG tablet Take 1 tablet (10 mg total) by mouth daily. 03/23/22   Minna Merritts, MD  ferrous sulfate 324 MG TBEC Take 324 mg by mouth.    [provider]  fluticasone (FLONASE) 50 MCG/ACT nasal spray  Place 2 sprays into both nostrils daily. 09/26/21   Eugenia Pancoast, FNP  gabapentin (NEURONTIN) 300 MG capsule Take 1 capsule (300 mg total) by mouth daily. 03/29/22 09/25/22  Eugenia Pancoast, FNP  latanoprost (XALATAN) 0.005 % ophthalmic solution 1 drop at bedtime. 03/19/22   [provider]  LORazepam (ATIVAN) 0.5 MG tablet TAKE 1 TABLET BY MOUTH AT BEDTIME AS NEEDED FOR SLEEP OR ANXIETY. 03/29/22   Eugenia Pancoast, FNP  losartan (COZAAR) 25 MG tablet Take 1 tablet (25 mg total) by mouth in the morning and at bedtime. 05/30/21   Minna Merritts, MD  timolol (TIMOPTIC-XR) 0.5 % ophthalmic  gel-forming Administer 1 drop to both eyes Take as directed. 02/10/13   [provider]  triamcinolone cream (KENALOG) 0.1 % Apply 1 Application topically 2 (two) times daily. 03/29/22   Eugenia Pancoast, FNP    Family History Family History  Problem Relation Age of Onset   Hypertension Mother    Early death Mother    Lung cancer Mother        smoker   Early death Father    Alcohol abuse Father    Heart attack Father 8   Hypertension Brother    Hyperlipidemia Brother    Heart attack Brother 53   Early death Brother    Diabetes Brother    Heart attack Brother 85   Early death Brother    Other Paternal Grandfather        guillan barre syndrome    Social History Social History   Tobacco Use   Smoking status: Never   Smokeless tobacco: Never  Vaping Use   Vaping Use: Never used  Substance Use Topics   Alcohol use: Never   Drug use: Never     Allergies   Tramadol and Diphenhydramine hcl   Review of Systems Review of Systems  Constitutional:  Negative for chills and fever.  Gastrointestinal:  Negative for abdominal pain, diarrhea, nausea and vomiting.  Genitourinary:  Positive for dysuria and frequency. Negative for flank pain, hematuria, pelvic pain and vaginal discharge.  Skin:  Negative for rash.  All other systems reviewed and are negative.    Physical Exam Triage Vital Signs ED Triage Vitals  Enc Vitals Group     BP      Pulse      Resp      Temp      Temp src      SpO2      Weight      Height      Head Circumference      Peak Flow      Pain Score      Pain Loc      Pain Edu?      Excl. in [place]?    No data found.  Updated Vital Signs BP (!) 143/80   Pulse 90   Temp 98.2 F (36.8 C)   Resp 18   SpO2 95%   Visual Acuity Right Eye Distance:   Left Eye Distance:   Bilateral Distance:    Right Eye Near:   Left Eye Near:    Bilateral Near:     Physical Exam Vitals and nursing note reviewed.  Constitutional:      General: She  is not in acute distress.    Appearance: Normal appearance. She is well-developed. She is not ill-appearing.  HENT:     Mouth/Throat:     Mouth: Mucous membranes are moist.  Cardiovascular:  Rate and Rhythm: Normal rate and regular rhythm.     Heart sounds: Normal heart sounds.  Pulmonary:     Effort: Pulmonary effort is normal. No respiratory distress.     Breath sounds: Normal breath sounds.  Abdominal:     General: Bowel sounds are normal.     Palpations: Abdomen is soft.     Tenderness: There is no abdominal tenderness. There is no right CVA tenderness, left CVA tenderness, guarding or rebound.  Musculoskeletal:     Cervical back: Neck supple.  Skin:    General: Skin is warm and dry.  Neurological:     Mental Status: She is alert.  Psychiatric:        Mood and Affect: Mood normal.        Behavior: Behavior normal.      UC Treatments / Results  Labs (all labs ordered are listed, but only abnormal results are displayed) Labs Reviewed  POCT URINALYSIS DIP (MANUAL ENTRY) - Abnormal; Notable for the following components:      Result Value   Spec Grav, UA <=1.005 (*)    Blood, UA moderate (*)    Leukocytes, UA Small (1+) (*)    All other components within normal limits  URINE CULTURE    EKG   Radiology No results found.  Procedures Procedures (including critical care time)  Medications Ordered in UC Medications - No data to display  Initial Impression / Assessment and Plan / UC Course  I have reviewed the triage vital signs and the nursing notes.  Pertinent labs & imaging results that were available during my care of the patient were reviewed by me and considered in my medical decision making (see chart for details).    Dysuria.  Treating with Keflex. Urine culture pending. Discussed with patient that we will call her if the urine culture shows the need to change or discontinue the antibiotic. Instructed her to follow-up with her PCP if her symptoms are not  improving. Patient agrees to plan of care.     Final Clinical Impressions(s) / UC Diagnoses   Final diagnoses:  Dysuria     Discharge Instructions      Take the antibiotic as directed.  The urine culture is pending.  We will call you if it shows the need to change or discontinue your antibiotic.    Follow up with your primary care provider if your symptoms are not improving.        ED Prescriptions     Medication Sig Dispense Auth. Provider   cephALEXin (KEFLEX) 500 MG capsule Take 1 capsule (500 mg total) by mouth 2 (two) times daily for 5 days. 10 capsule Sharion Balloon, NP      PDMP not reviewed this encounter.   Sharion Balloon, NP 07/09/22 1714

## 2022-07-09 NOTE — Discharge Instructions (Addendum)
Take the antibiotic as directed.  The urine culture is pending.  We will call you if it shows the need to change or discontinue your antibiotic.    Follow up with your primary care provider if your symptoms are not improving.    

## 2022-07-10 ENCOUNTER — Encounter: Payer: Self-pay | Admitting: Family

## 2022-07-10 DIAGNOSIS — L814 Other melanin hyperpigmentation: Secondary | ICD-10-CM | POA: Diagnosis not present

## 2022-07-10 DIAGNOSIS — L570 Actinic keratosis: Secondary | ICD-10-CM | POA: Diagnosis not present

## 2022-07-10 DIAGNOSIS — L821 Other seborrheic keratosis: Secondary | ICD-10-CM | POA: Diagnosis not present

## 2022-07-10 DIAGNOSIS — L820 Inflamed seborrheic keratosis: Secondary | ICD-10-CM | POA: Diagnosis not present

## 2022-07-10 NOTE — Telephone Encounter (Signed)
I spoke with the patient and she was going to be seen at an urgent care. Records shows she went yesterday.

## 2022-07-11 LAB — URINE CULTURE: Culture: >=100000 — AB

## 2022-08-06 ENCOUNTER — Other Ambulatory Visit: Payer: Self-pay | Admitting: Cardiovascular Disease

## 2022-08-06 NOTE — Telephone Encounter (Signed)
Refill request

## 2022-08-06 NOTE — Telephone Encounter (Signed)
Pt last saw Dr Rockey Situ 03/23/2022, last labs 03/29/22 Creat 0.62, age 89, weight 60.9kg, pt's weight is borderline for needing EWliquis dose adjustment.  Will continue to monitor weight and if contines to increase above 60kg may need to increase Eliquis dosage for afibv.  Will contine on Eliquis 2.5mg  BID for now given weight 60.9kg at present.

## 2022-08-15 DIAGNOSIS — Z0100 Encounter for examination of eyes and vision without abnormal findings: Secondary | ICD-10-CM | POA: Diagnosis not present

## 2022-08-15 DIAGNOSIS — H401133 Primary open-angle glaucoma, bilateral, severe stage: Secondary | ICD-10-CM | POA: Diagnosis not present

## 2022-08-15 DIAGNOSIS — Z961 Presence of intraocular lens: Secondary | ICD-10-CM | POA: Diagnosis not present

## 2022-09-13 DIAGNOSIS — H401133 Primary open-angle glaucoma, bilateral, severe stage: Secondary | ICD-10-CM | POA: Diagnosis not present

## 2022-09-21 DIAGNOSIS — H401133 Primary open-angle glaucoma, bilateral, severe stage: Secondary | ICD-10-CM | POA: Diagnosis not present

## 2022-09-22 ENCOUNTER — Encounter: Payer: Self-pay | Admitting: Family

## 2022-09-27 ENCOUNTER — Ambulatory Visit: Payer: Medicare PPO | Admitting: Family

## 2022-09-27 ENCOUNTER — Encounter: Payer: Self-pay | Admitting: Family

## 2022-09-27 VITALS — BP 136/78 | HR 82 | Temp 97.9°F | Ht 65.5 in | Wt 133.4 lb

## 2022-09-27 DIAGNOSIS — E538 Deficiency of other specified B group vitamins: Secondary | ICD-10-CM | POA: Diagnosis not present

## 2022-09-27 DIAGNOSIS — D509 Iron deficiency anemia, unspecified: Secondary | ICD-10-CM | POA: Diagnosis not present

## 2022-09-27 DIAGNOSIS — I10 Essential (primary) hypertension: Secondary | ICD-10-CM | POA: Diagnosis not present

## 2022-09-27 DIAGNOSIS — J301 Allergic rhinitis due to pollen: Secondary | ICD-10-CM

## 2022-09-27 DIAGNOSIS — M1990 Unspecified osteoarthritis, unspecified site: Secondary | ICD-10-CM | POA: Diagnosis not present

## 2022-09-27 DIAGNOSIS — E782 Mixed hyperlipidemia: Secondary | ICD-10-CM

## 2022-09-27 DIAGNOSIS — E119 Type 2 diabetes mellitus without complications: Secondary | ICD-10-CM

## 2022-09-27 DIAGNOSIS — E118 Type 2 diabetes mellitus with unspecified complications: Secondary | ICD-10-CM

## 2022-09-27 LAB — POCT GLYCOSYLATED HEMOGLOBIN (HGB A1C): Hemoglobin A1C: 6.7 % — AB (ref 4.0–5.6)

## 2022-09-27 NOTE — Assessment & Plan Note (Signed)
Ordered lipid panel, pending results. Work on low cholesterol diet and exercise as tolerated Continue zetia once daily

## 2022-09-27 NOTE — Assessment & Plan Note (Signed)
Stable continue f/u with cardiologist as scheduled.

## 2022-09-27 NOTE — Assessment & Plan Note (Signed)
  Worsening.  See Dr. Patsy Lager for eval Meloxicam not good option as with afib and on eliquis  Can take tylenol arthritis and also can use lidocaine patches and heat/ice

## 2022-09-27 NOTE — Assessment & Plan Note (Signed)
Recommendation to stop flonase and start rhinocort or nasacort.  Ipratropium bromide not an option due to glaucoma

## 2022-09-27 NOTE — Patient Instructions (Signed)
  Try over the counter nasocort or rhinocort.   Regards,   Mort Sawyers FNP-C

## 2022-09-27 NOTE — Progress Notes (Signed)
Established Patient Office Visit  Subjective:      CC:  Chief Complaint  Patient presents with   Medical Management of Chronic Issues    HPI: Janice Bush is a 89 y.o. female presenting on 09/27/2022 for Medical Management of Chronic Issues .  DM2: slight increase from 6.6 11/23 however stable. At goal <7 with A1C  Lab Results  Component Value Date   HGBA1C 6.7 (A) 09/27/2022   HLD: started on zetia 10 mg with dr. Mariah Milling 11/23.   HTN: also on amlodipine 5 m once daily and losartan 25 mg once daily, stable today.   Feeling tingling/numbness bil feet. She feels they have an 'extra layer of skin' on them. At bed especially can not tell if they are hot or cold, has been more noticeable over the last two years.   Insomnia: taking gabapentin and ativan 1/2 of 0.5 mg nightly. She states this is the only way that she can sleep. She states she is not unsteady, and does not fall, no recent falls. Does have fall necklace just in case.    Bil knee pain:  Worsening left than right.  Harder to come down stairs with limited rom.  Takes tylenol arthritis without any relief.   Social history:  Relevant past medical, surgical, family and social history reviewed and updated as indicated. Interim medical history since our last visit reviewed.  Allergies and medications reviewed and updated.  DATA REVIEWED: CHART IN EPIC     ROS: Negative unless specifically indicated above in HPI.    Current Outpatient Medications:    amLODipine (NORVASC) 5 MG tablet, TAKE 1 TABLET BY MOUTH DAILY, Disp: 90 tablet, Rfl: 3   apixaban (ELIQUIS) 2.5 MG TABS tablet, TAKE ONE TABLET (2.5 MG) BY MOUTH TWICE DAILY, Disp: 60 tablet, Rfl: 5   ascorbic acid (VITAMIN C) 500 MG tablet, Take by mouth., Disp: , Rfl:    blood glucose meter kit and supplies, Dispense based on patient and insurance preference. Use up to four times daily as directed. (FOR ICD-10 E10.9, E11.9)., Disp: 1 each, Rfl: 0    Cholecalciferol (VITAMIN D3 PO), Take by mouth., Disp: , Rfl:    digoxin (LANOXIN) 0.125 MG tablet, Take 1 tablet (125 mcg total) by mouth daily., Disp: 90 tablet, Rfl: 3   dorzolamide-timolol (COSOPT) 2-0.5 % ophthalmic solution, 1 drop 2 (two) times daily., Disp: , Rfl:    ezetimibe (ZETIA) 10 MG tablet, Take 1 tablet (10 mg total) by mouth daily., Disp: 90 tablet, Rfl: 3   fluticasone (FLONASE) 50 MCG/ACT nasal spray, Place 2 sprays into both nostrils daily., Disp: 16 g, Rfl: 6   gabapentin (NEURONTIN) 300 MG capsule, Take 1 capsule (300 mg total) by mouth daily., Disp: 90 capsule, Rfl: 1   latanoprost (XALATAN) 0.005 % ophthalmic solution, 1 drop at bedtime., Disp: , Rfl:    LORazepam (ATIVAN) 0.5 MG tablet, TAKE 1 TABLET BY MOUTH AT BEDTIME AS NEEDED FOR SLEEP OR ANXIETY., Disp: 30 tablet, Rfl: 5   losartan (COZAAR) 25 MG tablet, Take 1 tablet (25 mg total) by mouth in the morning and at bedtime., Disp: , Rfl:       Objective:    BP 136/78 (BP Location: Left Arm)   Pulse 82   Temp 97.9 F (36.6 C) (Temporal)   Ht 5' 5.5" (1.664 m)   Wt 133 lb 6.4 oz (60.5 kg)   SpO2 97%   BMI 21.86 kg/m   Wt Readings from Last 3 Encounters:  09/27/22 133 lb 6.4 oz (60.5 kg)  03/29/22 134 lb 4 oz (60.9 kg)  03/26/22 133 lb (60.3 kg)    Physical Exam Constitutional:      General: She is not in acute distress.    Appearance: Normal appearance. She is normal weight. She is not ill-appearing, toxic-appearing or diaphoretic.  HENT:     Head: Normocephalic.  Cardiovascular:     Rate and Rhythm: Normal rate and regular rhythm.     Pulses:          Dorsalis pedis pulses are 2+ on the right side and 2+ on the left side.       Posterior tibial pulses are 2+ on the right side and 2+ on the left side.  Pulmonary:     Effort: Pulmonary effort is normal.     Breath sounds: Normal breath sounds.  Musculoskeletal:        General: Normal range of motion.     Right lower leg: No edema.     Left lower  leg: No edema.  Neurological:     General: No focal deficit present.     Mental Status: She is alert and oriented to person, place, and time. Mental status is at baseline.  Psychiatric:        Mood and Affect: Mood normal.        Behavior: Behavior normal.        Thought Content: Thought content normal.        Judgment: Judgment normal.           Assessment & Plan:  Controlled type 2 diabetes mellitus without complication, without long-term current use of insulin (HCC) -     POCT glycosylated hemoglobin (Hb A1C) -     Digoxin level; Future -     Lipid panel; Future  Mixed hyperlipidemia Assessment & Plan: Ordered lipid panel, pending results. Work on low cholesterol diet and exercise as tolerated Continue zetia once daily   Orders: -     Digoxin level; Future -     Lipid panel; Future  Iron deficiency anemia, unspecified iron deficiency anemia type -     CBC; Future  Low serum vitamin B12 -     Vitamin B12; Future  Controlled type 2 diabetes mellitus with complication, without long-term current use of insulin (HCC) Assessment & Plan: Repeat A1c in office today, stable.  Work on diabetic diet and    Allergic rhinitis due to pollen, unspecified seasonality Assessment & Plan: Recommendation to stop flonase and start rhinocort or nasacort.  Ipratropium bromide not an option due to glaucoma   Arthritis Assessment & Plan:  Worsening.  See Dr. Patsy Lager for eval Meloxicam not good option as with afib and on eliquis  Can take tylenol arthritis and also can use lidocaine patches and heat/ice    Essential hypertension Assessment & Plan: Stable continue f/u with cardiologist as scheduled.      Return in about 6 months (around 03/30/2023) for f/u blood pressure, f/u insomnia .  Mort Sawyers, MSN, APRN, FNP-C [facility]

## 2022-09-27 NOTE — Assessment & Plan Note (Signed)
Repeat A1c in office today, stable.  Work on diabetic diet and

## 2022-09-30 NOTE — Progress Notes (Unsigned)
    Janice Lisenby T. Kaelene Elliston, MD, CAQ Sports Medicine [facility] [address] [place], [address]  Phone: [phone]  FAX: [phone]  Janice Bush - 89 y.o. female  MRN [id number]  Date of Birth: 1932/03/22  Date: 10/01/2022  PCP: Mort Sawyers, FNP  Referral: Mort Sawyers, FNP  No chief complaint on file.  Subjective:   Janice Bush is a 89 y.o. very pleasant female patient with There is no height or weight on file to calculate BMI. who presents with the following:  The patient presents with chronic left knee pain.    Review of Systems is noted in the HPI, as appropriate  Objective:   There were no vitals taken for this visit.  GEN: No acute distress; alert,appropriate. PULM: Breathing comfortably in no respiratory distress PSYCH: Normally interactive.   Laboratory and Imaging Data:  Assessment and Plan:   ***

## 2022-10-01 ENCOUNTER — Encounter: Payer: Self-pay | Admitting: Family Medicine

## 2022-10-01 ENCOUNTER — Ambulatory Visit: Payer: Medicare PPO | Admitting: Family Medicine

## 2022-10-01 VITALS — BP 130/60 | HR 60 | Temp 97.6°F | Ht 65.5 in | Wt 134.0 lb

## 2022-10-01 DIAGNOSIS — G8929 Other chronic pain: Secondary | ICD-10-CM

## 2022-10-01 DIAGNOSIS — M25562 Pain in left knee: Secondary | ICD-10-CM

## 2022-10-01 DIAGNOSIS — M1712 Unilateral primary osteoarthritis, left knee: Secondary | ICD-10-CM

## 2022-10-01 NOTE — Patient Instructions (Signed)
Find out from your insurance company the preferred product for hyaluronic acid injections.   - ask them what is your financial payment for this injection.

## 2022-10-02 DIAGNOSIS — E119 Type 2 diabetes mellitus without complications: Secondary | ICD-10-CM | POA: Diagnosis not present

## 2022-10-02 DIAGNOSIS — I480 Paroxysmal atrial fibrillation: Secondary | ICD-10-CM | POA: Diagnosis not present

## 2022-10-02 DIAGNOSIS — E782 Mixed hyperlipidemia: Secondary | ICD-10-CM | POA: Diagnosis not present

## 2022-10-02 DIAGNOSIS — E538 Deficiency of other specified B group vitamins: Secondary | ICD-10-CM | POA: Diagnosis not present

## 2022-10-02 DIAGNOSIS — D509 Iron deficiency anemia, unspecified: Secondary | ICD-10-CM | POA: Diagnosis not present

## 2022-10-04 ENCOUNTER — Encounter: Payer: Self-pay | Admitting: Family

## 2022-10-05 ENCOUNTER — Encounter: Payer: Self-pay | Admitting: Cardiovascular Disease

## 2022-10-05 ENCOUNTER — Encounter: Payer: Self-pay | Admitting: Family Medicine

## 2022-10-05 ENCOUNTER — Telehealth: Payer: Self-pay | Admitting: Family

## 2022-10-05 ENCOUNTER — Other Ambulatory Visit: Payer: Self-pay | Admitting: Family

## 2022-10-05 DIAGNOSIS — F5101 Primary insomnia: Secondary | ICD-10-CM

## 2022-10-05 NOTE — Telephone Encounter (Signed)
LAST APPOINTMENT DATE: 09/27/2022   NEXT APPOINTMENT DATE: 04/01/2023    LAST REFILL: 03/30/2023  QTY: #[phone] RF

## 2022-10-05 NOTE — Telephone Encounter (Signed)
Prescription Request  10/05/2022  LOV: 09/27/2022  What is the name of the medication or equipment? LORazepam (ATIVAN) 0.5 MG tablet, she is going out of town tomorrow morning and don't know when she will be back  Have you contacted your pharmacy to request a refill? Yes   Which pharmacy would you like this sent to?  [facility] - [place], [place] - [address] [place] [address] Phone: [phone] Fax: [phone]    Patient notified that their request is being sent to the clinical staff for review and that they should receive a response within 2 business days.   Please advise at Mobile [phone] (mobile)

## 2022-10-05 NOTE — Telephone Encounter (Signed)
Refill LORazepam (ATIVAN) 0.5 MG tablet  LR- 03/29/22 ( 30 tabs/ 5 refills) LV- 09/27/22 NV- 04/01/23

## 2022-11-04 ENCOUNTER — Encounter: Payer: Self-pay | Admitting: Family

## 2022-11-05 ENCOUNTER — Encounter: Payer: Self-pay | Admitting: Internal Medicine

## 2022-11-05 ENCOUNTER — Ambulatory Visit: Payer: Medicare PPO | Admitting: Internal Medicine

## 2022-11-05 VITALS — BP 118/74 | HR 88 | Temp 97.4°F | Ht 65.5 in | Wt 134.0 lb

## 2022-11-05 DIAGNOSIS — N3000 Acute cystitis without hematuria: Secondary | ICD-10-CM | POA: Diagnosis not present

## 2022-11-05 DIAGNOSIS — R350 Frequency of micturition: Secondary | ICD-10-CM | POA: Diagnosis not present

## 2022-11-05 LAB — POC URINALSYSI DIPSTICK (AUTOMATED)
Bilirubin, UA: NEGATIVE
Glucose, UA: NEGATIVE
Ketones, UA: NEGATIVE
Nitrite, UA: NEGATIVE
Protein, UA: POSITIVE — AB
Spec Grav, UA: 1.01 (ref 1.010–1.025)
Urobilinogen, UA: 0.2 E.U./dL
pH, UA: 6 (ref 5.0–8.0)

## 2022-11-05 MED ORDER — CEPHALEXIN 500 MG PO CAPS
500.0000 mg | ORAL_CAPSULE | Freq: Three times a day (TID) | ORAL | 1 refills | Status: AC
Start: 2022-11-05 — End: ?

## 2022-11-05 NOTE — Telephone Encounter (Signed)
Please call patient to set up office visit.  

## 2022-11-05 NOTE — Assessment & Plan Note (Addendum)
Second infection in 4 months Urinalysis 3+ leuks, dip positive blood Wonders why when never before Had been prescribed topical estrogen--then someone told her not to take it with uterine cancer history (but had hyster) Discussed cranberry tabs. Is already on vitamin C Did well with cephalexin (had E coli ---pansensitive) Will treat again 500 tid x 3 days

## 2022-11-05 NOTE — Telephone Encounter (Signed)
Patient called in and got scheduled for today with Dr. Alphonsus Sias.

## 2022-11-05 NOTE — Progress Notes (Signed)
Subjective:    Patient ID: Janice Bush, female    DOB: December 14, 1931, 89 y.o.   MRN: [id number]  HPI Here due to urinary symptoms  This is not common for her--had spell a few months ago 2 nights ago---up with urinary frequency and incontinence Burning dysuria  Started a week ago---mild burning. Thought it might be vaginal Tried increased fluids and cranberry juice Worsened 2 days ago  No fever No visible blood  Current Outpatient Medications on File Prior to Visit  Medication Sig Dispense Refill   amLODipine (NORVASC) 5 MG tablet TAKE 1 TABLET BY MOUTH DAILY 90 tablet 3   apixaban (ELIQUIS) 2.5 MG TABS tablet TAKE ONE TABLET (2.5 MG) BY MOUTH TWICE DAILY 60 tablet 5   ascorbic acid (VITAMIN C) 500 MG tablet Take by mouth.     blood glucose meter kit and supplies Dispense based on patient and insurance preference. Use up to four times daily as directed. (FOR ICD-10 E10.9, E11.9). 1 each 0   Cholecalciferol (VITAMIN D3 PO) Take by mouth.     digoxin (LANOXIN) 0.125 MG tablet Take 1 tablet (125 mcg total) by mouth daily. 90 tablet 3   dorzolamide-timolol (COSOPT) 2-0.5 % ophthalmic solution 1 drop 2 (two) times daily.     ezetimibe (ZETIA) 10 MG tablet Take 1 tablet (10 mg total) by mouth daily. 90 tablet 3   fluticasone (FLONASE) 50 MCG/ACT nasal spray Place 2 sprays into both nostrils daily. 16 g 6   gabapentin (NEURONTIN) 300 MG capsule Take 1 capsule (300 mg total) by mouth daily. 90 capsule 1   latanoprost (XALATAN) 0.005 % ophthalmic solution 1 drop at bedtime.     LORazepam (ATIVAN) 0.5 MG tablet TAKE ONE TABLET BY MOUTH AT BEDTIME AS NEEDED FOR SLEEP OR ANXIETY 30 tablet 1   losartan (COZAAR) 25 MG tablet Take 1 tablet (25 mg total) by mouth in the morning and at bedtime.     No current facility-administered medications on file prior to visit.    Allergies  Allergen Reactions   Tramadol Other (See Comments)    Felt groggy and confused for a few days after taking one  dose.   Diphenhydramine Hcl Anxiety    Past Medical History:  Diagnosis Date   Afib (HCC)    Allergy    Arthritis    Cancer (HCC)    Glaucoma    Heart murmur    Hyperlipidemia    Hypertension    Migraine without status migrainosus, not intractable 11/19/2012   Migraines    Uterine cancer (HCC) 2012    Past Surgical History:  Procedure Laterality Date   BREAST LUMPECTOMY Left 1970   ROBOTIC ASSISTED LAP VAGINAL HYSTERECTOMY N/A    Full hysterectomy per patient was not abdominal was robotic.   TONSILLECTOMY AND ADENOIDECTOMY      Family History  Problem Relation Age of Onset   Hypertension Mother    Early death Mother    Lung cancer Mother        smoker   Early death Father    Alcohol abuse Father    Heart attack Father 54   Hypertension Brother    Hyperlipidemia Brother    Heart attack Brother 31   Early death Brother    Diabetes Brother    Heart attack Brother 21   Early death Brother    Other Paternal Grandfather        guillan barre syndrome    Social History   Socioeconomic History  Marital status: Widowed    Spouse name: Not on file   Number of children: Not on file   Years of education: Not on file   Highest education level: Master's degree (e.g., MA, MS, MEng, MEd, MSW, MBA)  Occupational History   Not on file  Tobacco Use   Smoking status: Never   Smokeless tobacco: Never  Vaping Use   Vaping Use: Never used  Substance and Sexual Activity   Alcohol use: Never   Drug use: Never   Sexual activity: Not Currently  Other Topics Concern   Not on file  Social History Narrative   Not on file   Social Determinants of Health   Financial Resource Strain: Low Risk  (09/23/2022)   Overall Financial Resource Strain (CARDIA)    Difficulty of Paying Living Expenses: Not hard at all  Food Insecurity: No Food Insecurity (09/23/2022)   Hunger Vital Sign    Worried About Running Out of Food in the Last Year: Never true    Ran Out of Food in the Last  Year: Never true  Transportation Needs: No Transportation Needs (09/23/2022)   PRAPARE - Administrator, Civil Service (Medical): No    Lack of Transportation (Non-Medical): No  Physical Activity: Insufficiently Active (09/23/2022)   Exercise Vital Sign    Days of Exercise per Week: 2 days    Minutes of Exercise per Session: 30 min  Stress: No Stress Concern Present (09/23/2022)   [employer] of Occupational Health - Occupational Stress Questionnaire    Feeling of Stress : Only a little  Social Connections: Moderately Integrated (09/23/2022)   Social Connection and Isolation Panel [NHANES]    Frequency of Communication with Friends and Family: Twice a week    Frequency of Social Gatherings with Friends and Family: More than three times a week    Attends Religious Services: 1 to 4 times per year    Active Member of [employer] or Organizations: Yes    Attends Banker Meetings: More than 4 times per year    Marital Status: Widowed  Catering manager Violence: Not on file   Review of Systems No N/V No back pain No sexual relationship since husband died No changes in personal hygiene     Objective:   Physical Exam Constitutional:      Appearance: Normal appearance.  Abdominal:     Palpations: Abdomen is soft.     Tenderness: There is no abdominal tenderness. There is no guarding.  Neurological:     Mental Status: She is alert.            Assessment & Plan:

## 2022-11-12 ENCOUNTER — Other Ambulatory Visit: Payer: Self-pay | Admitting: Family

## 2022-11-12 DIAGNOSIS — G8929 Other chronic pain: Secondary | ICD-10-CM

## 2022-11-30 DIAGNOSIS — H401133 Primary open-angle glaucoma, bilateral, severe stage: Secondary | ICD-10-CM | POA: Diagnosis not present

## 2022-12-10 ENCOUNTER — Other Ambulatory Visit: Payer: Self-pay | Admitting: Family

## 2022-12-10 DIAGNOSIS — F5101 Primary insomnia: Secondary | ICD-10-CM

## 2022-12-16 IMAGING — MG MM DIGITAL SCREENING BILAT W/ TOMO AND CAD
6 of 12 series · 6 of 36 positions shown · non-contrast
Comparison: Previous exam(s).

CLINICAL DATA: Screening.

EXAM:
DIGITAL SCREENING BILATERAL MAMMOGRAM WITH TOMOSYNTHESIS AND CAD
TECHNIQUE: Bilateral screening digital craniocaudal and mediolateral oblique
mammograms were obtained. Bilateral screening digital breast
tomosynthesis was performed. The images were evaluated with
computer-aided detection.

[R CC synth-2D (1 of 2)]
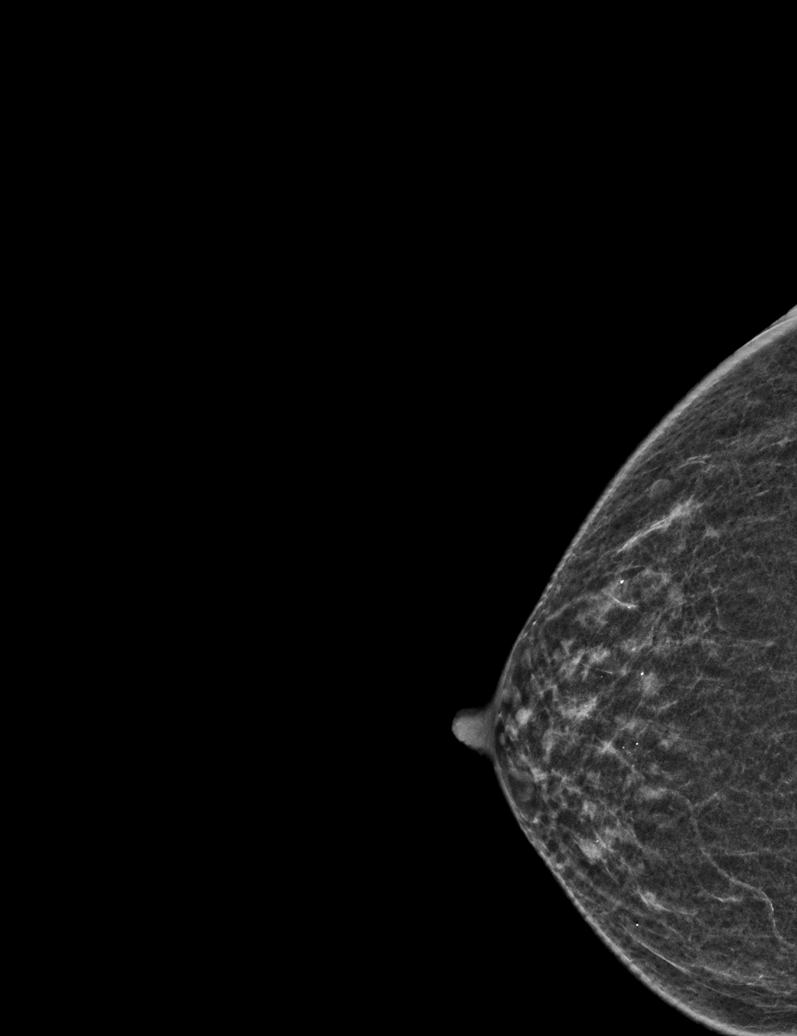

[L MLO synth-2D (1 of 2)]
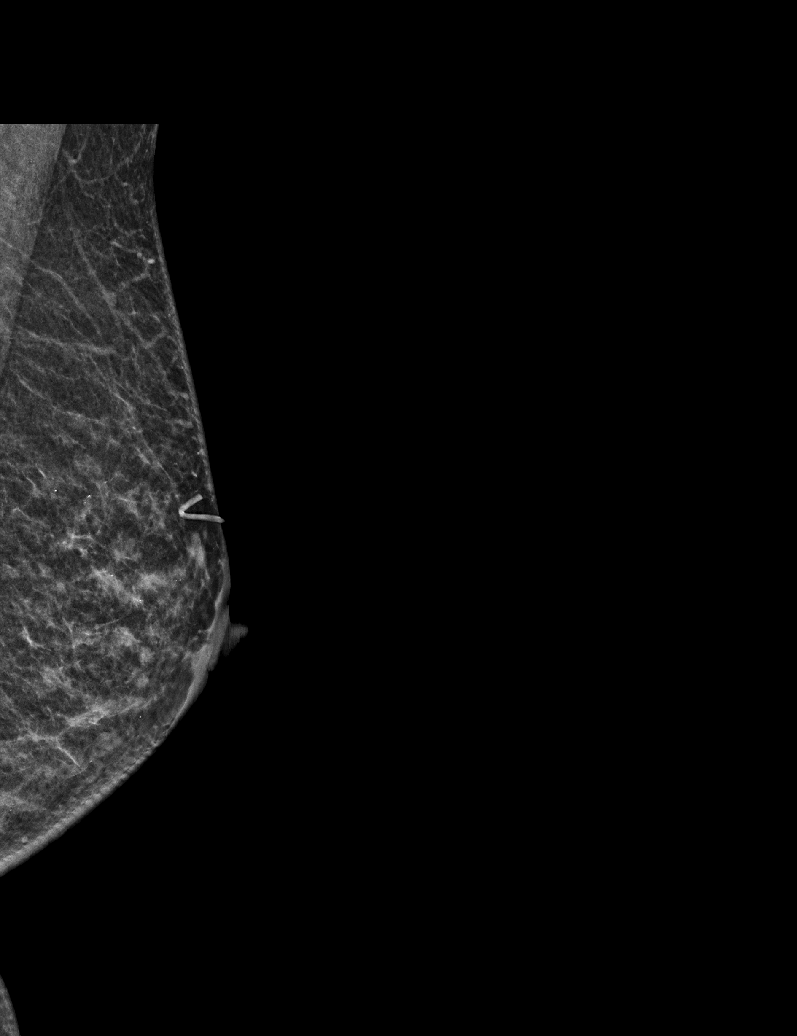

[L CC synth-2D]
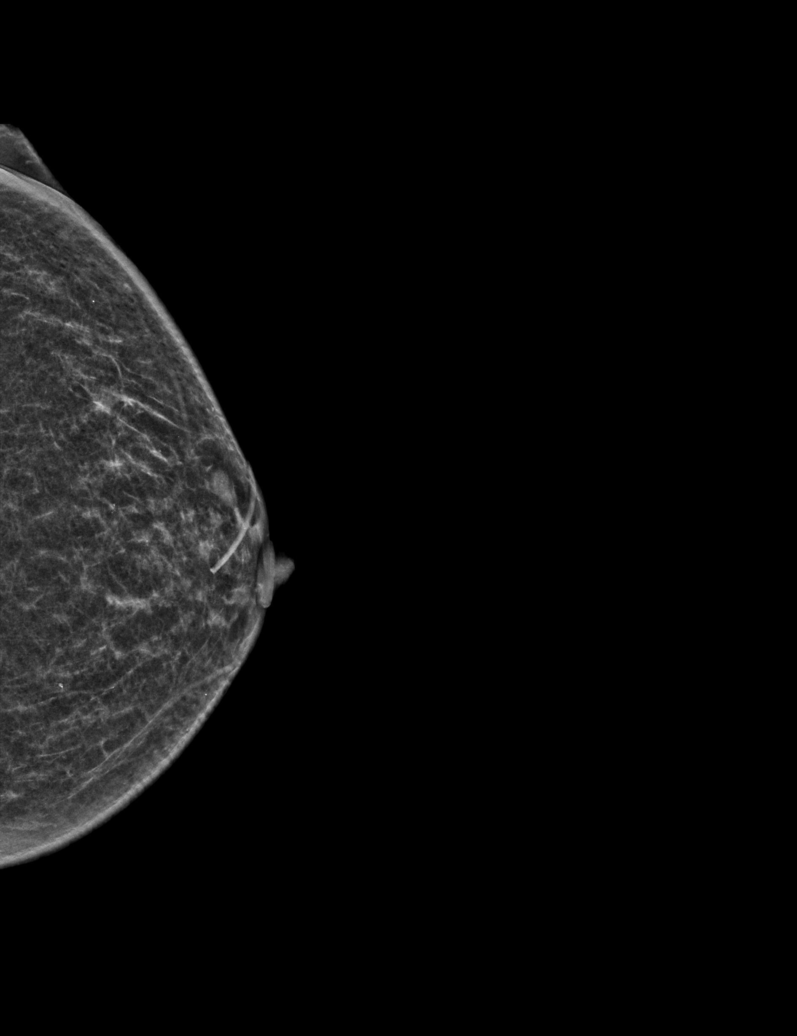

[L MLO synth-2D (2 of 2)]
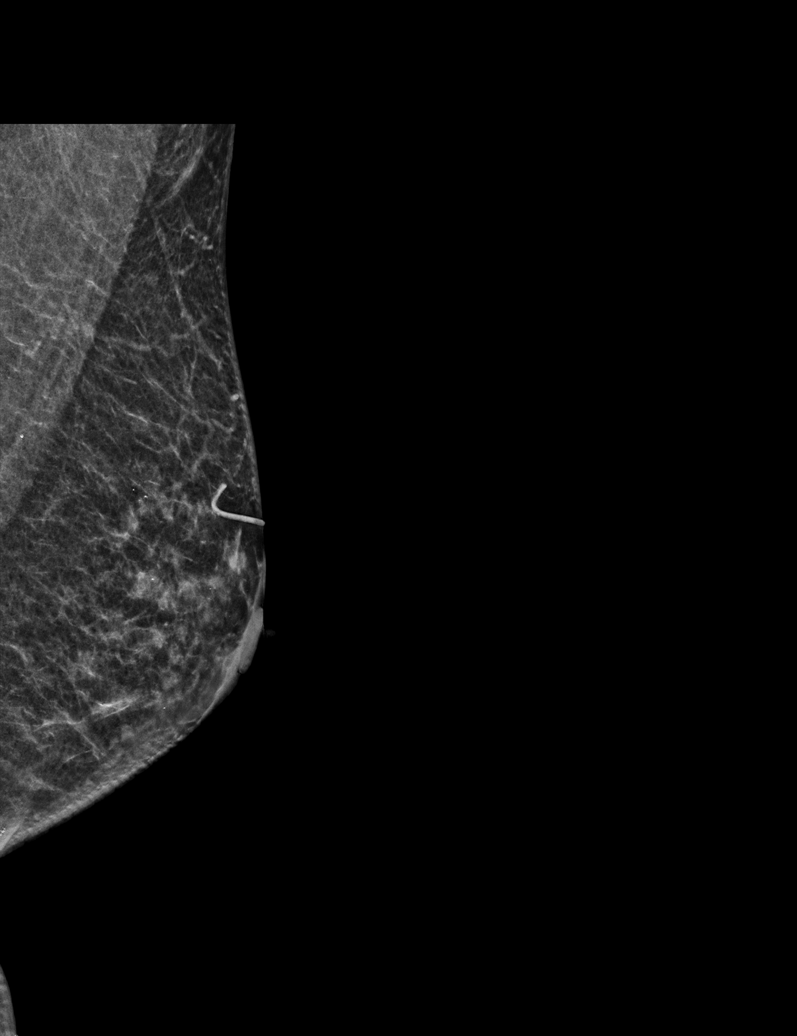

[R MLO synth-2D]
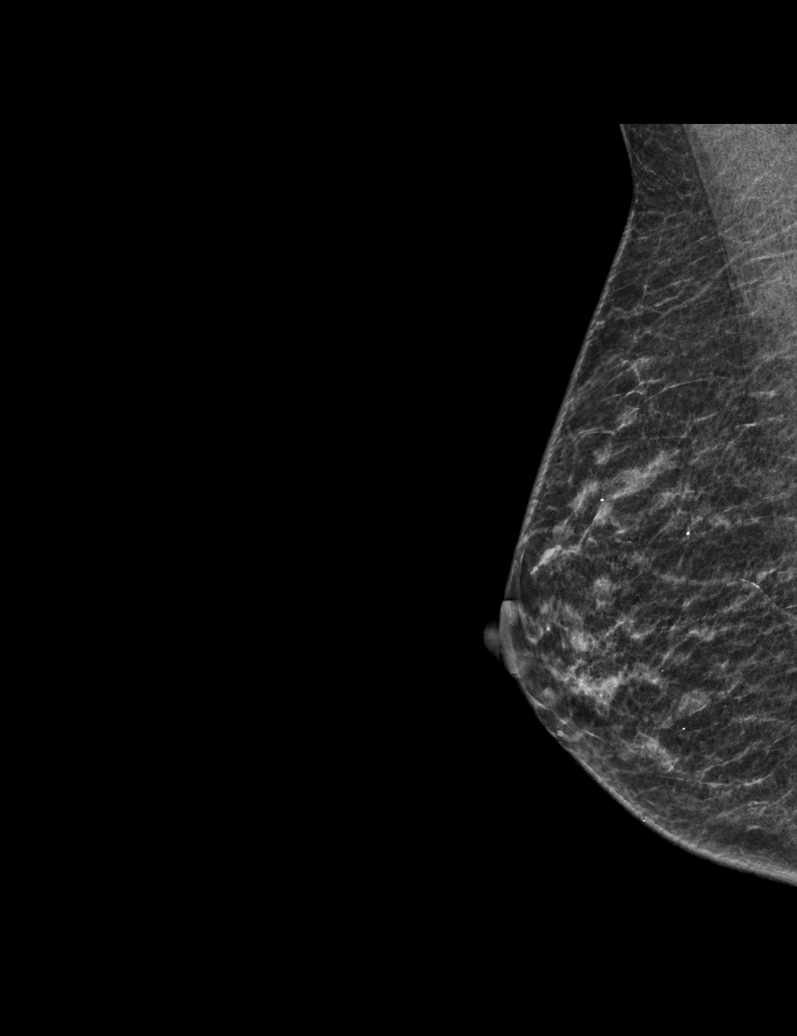

[R CC synth-2D (2 of 2)]
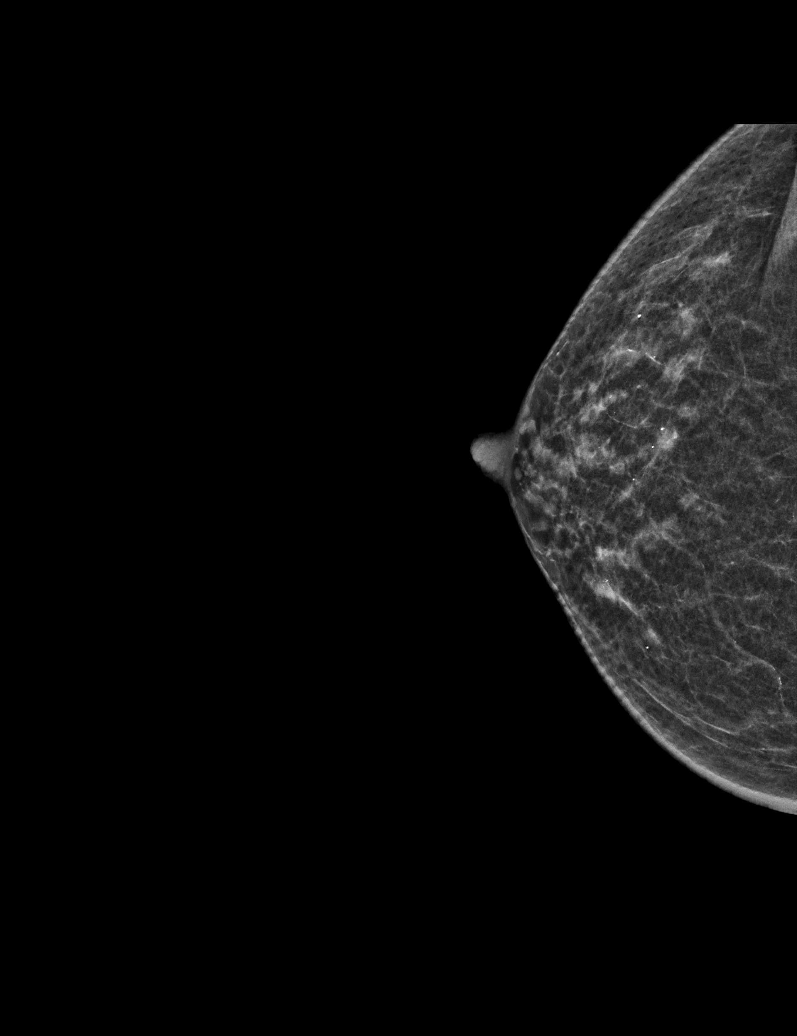

[6 of 36 positions shown; findings below may reference images not displayed]

ACR Breast Density Category b: There are scattered areas of
fibroglandular density.
FINDINGS: There are no findings suspicious for malignancy.
IMPRESSION: No mammographic evidence of malignancy. A result letter of this
screening mammogram will be mailed directly to the patient.

RECOMMENDATION:
Screening mammogram in one year. (Code:51-O-LD2)

BI-RADS CATEGORY  1: Negative.

## 2022-12-17 ENCOUNTER — Other Ambulatory Visit: Payer: Self-pay | Admitting: Family

## 2022-12-17 DIAGNOSIS — Z1231 Encounter for screening mammogram for malignant neoplasm of breast: Secondary | ICD-10-CM

## 2022-12-18 ENCOUNTER — Encounter: Payer: Self-pay | Admitting: Family

## 2022-12-19 ENCOUNTER — Other Ambulatory Visit: Payer: Self-pay

## 2022-12-22 ENCOUNTER — Encounter: Payer: Self-pay | Admitting: Family

## 2022-12-24 NOTE — Telephone Encounter (Signed)
Pt called requested a phone call with advice from a nurse. Pt stated she doesn't believe she needs any meds nor a virtual visit. Call back # [phone]

## 2022-12-26 NOTE — Telephone Encounter (Signed)
Pt reports she is feeling better, is now on day 6.  No more fever, not taking OTC. Pt lives at [facility] and is having her meals delivered.  Encouraged pt to call back if she has and new SOB or increased fatigue.  Pt verbalized understanding.

## 2022-12-27 NOTE — Telephone Encounter (Signed)
Noted! Thank you

## 2023-01-13 ENCOUNTER — Encounter: Payer: Self-pay | Admitting: Family

## 2023-01-13 DIAGNOSIS — E118 Type 2 diabetes mellitus with unspecified complications: Secondary | ICD-10-CM

## 2023-01-14 ENCOUNTER — Other Ambulatory Visit: Payer: Self-pay | Admitting: Family

## 2023-01-14 NOTE — Telephone Encounter (Signed)
Are we able to fax lab order over to facility? Placed in future orders.

## 2023-01-14 NOTE — Telephone Encounter (Signed)
Patient called back in and stated that if tabitha would like for her to have a blood draw to check her A1C at the living facility she's at,she could do that.Please advise

## 2023-01-14 NOTE — Telephone Encounter (Signed)
FAX [phone]

## 2023-01-14 NOTE — Telephone Encounter (Signed)
Do not see where you advised to start anything in the past. Do you want [place] to call patient to set up office visit?

## 2023-01-14 NOTE — Telephone Encounter (Signed)
Lab orders faxed to facility at number provided.

## 2023-01-15 ENCOUNTER — Ambulatory Visit
Admission: RE | Admit: 2023-01-15 | Discharge: 2023-01-15 | Disposition: A | Discharge status: Home / Self Care | Payer: Medicare PPO | Source: Ambulatory Visit | Attending: Family | Admitting: Family

## 2023-01-15 DIAGNOSIS — Z1231 Encounter for screening mammogram for malignant neoplasm of breast: Secondary | ICD-10-CM

## 2023-01-15 DIAGNOSIS — E118 Type 2 diabetes mellitus with unspecified complications: Secondary | ICD-10-CM | POA: Diagnosis not present

## 2023-01-16 LAB — HEMOGLOBIN A1C: A1c: 7.4

## 2023-01-17 ENCOUNTER — Telehealth: Payer: Self-pay | Admitting: Family

## 2023-01-17 NOTE — Telephone Encounter (Signed)
Patient called in stating that she seen her message regarding her labs through [facility]. She states that she does not think that there are many areas in her diet that she can work on.She said that she would consider being placed on a medication. She said that she is tired of getting up several times In the middle of the night and having to use the bathroom.

## 2023-01-22 MED ORDER — LINAGLIPTIN 5 MG PO TABS
5.0000 mg | ORAL_TABLET | Freq: Every day | ORAL | 0 refills | Status: AC
Start: 2023-01-22 — End: ?

## 2023-01-23 ENCOUNTER — Encounter: Payer: Self-pay | Admitting: Family

## 2023-01-30 ENCOUNTER — Other Ambulatory Visit: Payer: Self-pay | Admitting: Family

## 2023-01-30 DIAGNOSIS — E119 Type 2 diabetes mellitus without complications: Secondary | ICD-10-CM

## 2023-01-31 MED ORDER — LANCETS MISC. MISC
1.0000 | Freq: Three times a day (TID) | 0 refills | Status: AC
Start: 2023-01-31 — End: 2023-03-02

## 2023-01-31 MED ORDER — BLOOD GLUCOSE TEST VI STRP
1.0000 | ORAL_STRIP | Freq: Three times a day (TID) | 0 refills | Status: DC
Start: 2023-01-31 — End: 2024-01-13

## 2023-01-31 MED ORDER — BLOOD GLUCOSE MONITORING SUPPL DEVI
1.0000 | Freq: Three times a day (TID) | 0 refills | Status: AC
Start: 2023-01-31 — End: ?

## 2023-01-31 MED ORDER — BLOOD GLUCOSE METER KIT
PACK | 0 refills | Status: AC
Start: 2023-01-31 — End: ?

## 2023-01-31 MED ORDER — LANCET DEVICE MISC
1.0000 | Freq: Three times a day (TID) | 0 refills | Status: AC
Start: 2023-01-31 — End: 2023-03-02

## 2023-01-31 NOTE — Addendum Note (Signed)
Addended by: Mort Sawyers on: 01/31/2023 07:54 AM   Modules accepted: Orders

## 2023-02-01 DIAGNOSIS — H401133 Primary open-angle glaucoma, bilateral, severe stage: Secondary | ICD-10-CM | POA: Diagnosis not present

## 2023-02-14 ENCOUNTER — Other Ambulatory Visit: Payer: Self-pay | Admitting: Cardiovascular Disease

## 2023-02-14 DIAGNOSIS — I480 Paroxysmal atrial fibrillation: Secondary | ICD-10-CM

## 2023-02-14 NOTE — Telephone Encounter (Signed)
Please review

## 2023-02-14 NOTE — Telephone Encounter (Signed)
Per Laural Golden PharmD, pt's dose should be increased to 5mg  BID due to increased weight. I called pt to make her aware of dose change and pt states she has actually lost weight since she was last in the office and currently weighs 128lbs (58.06kg). Since current weight is <60kg, will keep pt on 2.5mg  BID at this time. Refill sent.

## 2023-02-14 NOTE — Telephone Encounter (Signed)
Prescription refill request for Eliquis received. Indication: Afib  Last office visit: 03/23/22 Mariah Milling)  Scr: 0.62 (03/29/22)  Age: 89 Weight: 60.8kg  Per dosing criteria, current dose not appropriate. Weight >60kg. Will forward to PharmD for advice.

## 2023-02-17 ENCOUNTER — Encounter: Payer: Self-pay | Admitting: Family

## 2023-02-18 ENCOUNTER — Telehealth: Payer: Self-pay | Admitting: Family

## 2023-02-18 ENCOUNTER — Encounter: Payer: Self-pay | Admitting: Internal Medicine

## 2023-02-18 ENCOUNTER — Ambulatory Visit: Payer: Medicare PPO | Admitting: Internal Medicine

## 2023-02-18 VITALS — BP 102/70 | HR 51 | Temp 97.9°F | Ht 65.5 in | Wt 127.0 lb

## 2023-02-18 DIAGNOSIS — E118 Type 2 diabetes mellitus with unspecified complications: Secondary | ICD-10-CM

## 2023-02-18 DIAGNOSIS — Z7984 Long term (current) use of oral hypoglycemic drugs: Secondary | ICD-10-CM | POA: Diagnosis not present

## 2023-02-18 DIAGNOSIS — H8110 Benign paroxysmal vertigo, unspecified ear: Secondary | ICD-10-CM

## 2023-02-18 MED ORDER — METFORMIN HCL ER 500 MG PO TB24: 500.0000 mg | ORAL_TABLET | Freq: Every day | ORAL | 1 refills | Status: DC

## 2023-02-18 NOTE — Telephone Encounter (Signed)
FYI: This call has been transferred to Access Nurse. Once the result note has been entered staff can address the message at that time.  Patient called in with the following symptoms:  Red Word:dizziness    Please advise at [facility] [phone]  Message is routed to Provider Pool and [facility] Triage   Pt called in stating she was told via mychart to schedule an appt for her vertigo issues. Pt states she's been dizzy for awhile. Scheduled pt with Dr. Alphonsus Sias today, 9/30, @ 2pm. Transferred pt access nurse.

## 2023-02-18 NOTE — Progress Notes (Signed)
Subjective:    Patient ID: Janice Bush, female    DOB: 1931-09-03, 89 y.o.   MRN: [id number]  HPI Here due to vertigo  Having vertigo---wakes with the room spinning Started 5 days ago--thought it was the weather Sleeps in the recliner which helps  Has had this before--but not lately Usually would go away with Epley maneuver Wants to go to rehab where she lives for this  Feels tired and lethargic Tires out walking more Has meclizine but hasn't been taking--"it makes me a zombie"  Slight balance problems--unsteady at times No focal weakness or sensory change. No facial droop, trouble speaking, etc  Never filled the trajenta Expensive and told by pharmacist that it was not typically first line Working more on eating better Fasting still 140's to 150's  Current Outpatient Medications on File Prior to Visit  Medication Sig Dispense Refill   Accu-Chek Softclix Lancets lancets USE TO TEST 4 TIMES DAILY AS DIRECTED. 100 each 3   amLODipine (NORVASC) 5 MG tablet TAKE 1 TABLET BY MOUTH DAILY 90 tablet 3   apixaban (ELIQUIS) 2.5 MG TABS tablet Take 1 tablet (2.5 mg total) by mouth 2 (two) times daily. 60 tablet 5   ascorbic acid (VITAMIN C) 500 MG tablet Take by mouth.     blood glucose meter kit and supplies Dispense based on patient and insurance preference. Use up to four times daily as directed 1 each 0   Blood Glucose Monitoring Suppl DEVI 1 each by Does not apply route in the morning, at noon, and at bedtime. May substitute to any manufacturer covered by patient's insurance. 1 each 0   brimonidine (ALPHAGAN) 0.2 % ophthalmic solution      Cholecalciferol (VITAMIN D3 PO) Take by mouth.     clobetasol (TEMOVATE) 0.05 % external solution APPLY TO AFFECTED AREAS TWICE DAILY AS DIRECTED 50 mL 2   digoxin (LANOXIN) 0.125 MG tablet Take 1 tablet (125 mcg total) by mouth daily. 90 tablet 3   dorzolamide-timolol (COSOPT) 2-0.5 % ophthalmic solution 1 drop 2 (two) times daily.      ezetimibe (ZETIA) 10 MG tablet Take 1 tablet (10 mg total) by mouth daily. 90 tablet 3   fluticasone (FLONASE) 50 MCG/ACT nasal spray Place 2 sprays into both nostrils daily. 16 g 6   gabapentin (NEURONTIN) 300 MG capsule TAKE 1 CAPSULE BY MOUTH EVERY DAY 90 capsule 3   Glucose Blood (BLOOD GLUCOSE TEST STRIPS) STRP 1 each by In Vitro route in the morning, at noon, and at bedtime. May substitute to any manufacturer covered by patient's insurance. 100 strip 0   Lancet Device MISC 1 each by Does not apply route in the morning, at noon, and at bedtime. May substitute to any manufacturer covered by patient's insurance. 1 each 0   Lancets Misc. MISC 1 each by Does not apply route in the morning, at noon, and at bedtime. May substitute to any manufacturer covered by patient's insurance. 100 each 0   latanoprost (XALATAN) 0.005 % ophthalmic solution 1 drop at bedtime.     linagliptin (TRADJENTA) 5 MG TABS tablet Take 1 tablet (5 mg total) by mouth daily. 90 tablet 0   LORazepam (ATIVAN) 0.5 MG tablet TAKE ONE TABLET BY MOUTH AT BEDTIME AS NEEDED FOR SLEEP OR ANXIETY 30 tablet 2   losartan (COZAAR) 25 MG tablet Take 1 tablet (25 mg total) by mouth in the morning and at bedtime.     No current facility-administered medications on file prior to  visit.    Allergies  Allergen Reactions   Tramadol Other (See Comments)    Felt groggy and confused for a few days after taking one dose.   Diphenhydramine Hcl Anxiety    Past Medical History:  Diagnosis Date   Afib (HCC)    Allergy    Arthritis    Cancer (HCC)    Glaucoma    Heart murmur    Hyperlipidemia    Hypertension    Migraine without status migrainosus, not intractable 11/19/2012   Migraines    Uterine cancer (HCC) 2012    Past Surgical History:  Procedure Laterality Date   BREAST EXCISIONAL BIOPSY Left    age 43-35   ROBOTIC ASSISTED LAP VAGINAL HYSTERECTOMY N/A    Full hysterectomy per patient was not abdominal was robotic.    TONSILLECTOMY AND ADENOIDECTOMY      Family History  Problem Relation Age of Onset   Hypertension Mother    Early death Mother    Lung cancer Mother        smoker   Early death Father    Alcohol abuse Father    Heart attack Father 39   Other Paternal Grandfather        guillan barre syndrome   Hypertension Brother    Hyperlipidemia Brother    Heart attack Brother 71   Early death Brother    Diabetes Brother    Heart attack Brother 73   Early death Brother    Breast cancer Neg Hx     Social History   Socioeconomic History   Marital status: Widowed    Spouse name: Not on file   Number of children: Not on file   Years of education: Not on file   Highest education level: Master's degree (e.g., MA, MS, MEng, MEd, MSW, MBA)  Occupational History   Not on file  Tobacco Use   Smoking status: Never   Smokeless tobacco: Never  Vaping Use   Vaping status: Never Used  Substance and Sexual Activity   Alcohol use: Never   Drug use: Never   Sexual activity: Not Currently  Other Topics Concern   Not on file  Social History Narrative   Not on file   Social Determinants of Health   Financial Resource Strain: Low Risk  (09/23/2022)   Overall Financial Resource Strain (CARDIA)    Difficulty of Paying Living Expenses: Not hard at all  Food Insecurity: No Food Insecurity (09/23/2022)   Hunger Vital Sign    Worried About Running Out of Food in the Last Year: Never true    Ran Out of Food in the Last Year: Never true  Transportation Needs: No Transportation Needs (09/23/2022)   PRAPARE - Administrator, Civil Service (Medical): No    Lack of Transportation (Non-Medical): No  Physical Activity: Insufficiently Active (09/23/2022)   Exercise Vital Sign    Days of Exercise per Week: 2 days    Minutes of Exercise per Session: 30 min  Stress: No Stress Concern Present (09/23/2022)   [employer] of Occupational Health - Occupational Stress Questionnaire    Feeling of Stress  : Only a little  Social Connections: Moderately Integrated (09/23/2022)   Social Connection and Isolation Panel [NHANES]    Frequency of Communication with Friends and Family: Twice a week    Frequency of Social Gatherings with Friends and Family: More than three times a week    Attends Religious Services: 1 to 4 times per year  Active Member of Clubs or Organizations: Yes    Attends Banker Meetings: More than 4 times per year    Marital Status: Widowed  Catering manager Violence: Not on file   Review of Systems No tinnitu Has hearing aides---not wearing today (no change)    Objective:   Physical Exam Constitutional:      Appearance: Normal appearance.  Eyes:     Extraocular Movements: Extraocular movements intact.     Comments: ??very slight horizontal nystagmus  Cardiovascular:     Rate and Rhythm: Normal rate. Rhythm irregular.     Heart sounds: No murmur heard.    No gallop.  Pulmonary:     Effort: Pulmonary effort is normal.     Breath sounds: Normal breath sounds. No wheezing or rales.  Musculoskeletal:     Cervical back: Neck supple.  Lymphadenopathy:     Cervical: No cervical adenopathy.  Neurological:     Mental Status: She is alert and oriented to person, place, and time.     Cranial Nerves: Cranial nerves 2-12 are intact.     Sensory: Sensation is intact.     Motor: No weakness or tremor.     Coordination: Romberg sign negative.     Gait: Gait normal.            Assessment & Plan:

## 2023-02-18 NOTE — Assessment & Plan Note (Signed)
Seems to be a spell of this again----nothing to suggest CVA or intracranial pathology Will make referral to PT at [facility] [place] ---to consider Epley maneuver

## 2023-02-18 NOTE — Assessment & Plan Note (Signed)
Didn't want the trajenta Discussed metformin--okay to use since her renal function is okay Will try metformin 500mg  daily

## 2023-02-18 NOTE — Telephone Encounter (Signed)
Okay, I will assess her at the visit today

## 2023-02-18 NOTE — Telephone Encounter (Signed)
Pt already has appt with Dr Alphonsus Sias  on 02/18/23 at 2pm.sending note to Dr Alphonsus Sias and Alphonsus Sias pool.

## 2023-03-08 DIAGNOSIS — L853 Xerosis cutis: Secondary | ICD-10-CM | POA: Diagnosis not present

## 2023-03-08 DIAGNOSIS — B079 Viral wart, unspecified: Secondary | ICD-10-CM | POA: Diagnosis not present

## 2023-03-08 DIAGNOSIS — L570 Actinic keratosis: Secondary | ICD-10-CM | POA: Diagnosis not present

## 2023-03-08 DIAGNOSIS — L219 Seborrheic dermatitis, unspecified: Secondary | ICD-10-CM | POA: Diagnosis not present

## 2023-03-14 ENCOUNTER — Encounter: Payer: Self-pay | Admitting: Internal Medicine

## 2023-03-18 ENCOUNTER — Other Ambulatory Visit: Payer: Self-pay | Admitting: Family

## 2023-03-18 DIAGNOSIS — F5101 Primary insomnia: Secondary | ICD-10-CM

## 2023-04-01 ENCOUNTER — Other Ambulatory Visit: Payer: Self-pay | Admitting: Cardiovascular Disease

## 2023-04-01 ENCOUNTER — Encounter: Payer: Self-pay | Admitting: Family

## 2023-04-01 ENCOUNTER — Ambulatory Visit: Payer: Medicare PPO | Admitting: Family

## 2023-04-01 VITALS — BP 130/70 | HR 72 | Temp 98.0°F | Ht 65.5 in | Wt 128.8 lb

## 2023-04-01 DIAGNOSIS — E118 Type 2 diabetes mellitus with unspecified complications: Secondary | ICD-10-CM | POA: Diagnosis not present

## 2023-04-01 DIAGNOSIS — E538 Deficiency of other specified B group vitamins: Secondary | ICD-10-CM | POA: Diagnosis not present

## 2023-04-01 DIAGNOSIS — E782 Mixed hyperlipidemia: Secondary | ICD-10-CM | POA: Diagnosis not present

## 2023-04-01 DIAGNOSIS — R42 Dizziness and giddiness: Secondary | ICD-10-CM

## 2023-04-01 DIAGNOSIS — R0609 Other forms of dyspnea: Secondary | ICD-10-CM | POA: Diagnosis not present

## 2023-04-01 DIAGNOSIS — Z7984 Long term (current) use of oral hypoglycemic drugs: Secondary | ICD-10-CM

## 2023-04-01 DIAGNOSIS — I10 Essential (primary) hypertension: Secondary | ICD-10-CM | POA: Diagnosis not present

## 2023-04-01 DIAGNOSIS — H8113 Benign paroxysmal vertigo, bilateral: Secondary | ICD-10-CM | POA: Diagnosis not present

## 2023-04-01 DIAGNOSIS — I480 Paroxysmal atrial fibrillation: Secondary | ICD-10-CM

## 2023-04-01 DIAGNOSIS — D509 Iron deficiency anemia, unspecified: Secondary | ICD-10-CM

## 2023-04-01 DIAGNOSIS — Z79899 Other long term (current) drug therapy: Secondary | ICD-10-CM | POA: Diagnosis not present

## 2023-04-01 LAB — CBC
HCT: 39.9 % (ref 36.0–46.0)
Hemoglobin: 13.1 g/dL (ref 12.0–15.0)
MCHC: 32.9 g/dL (ref 30.0–36.0)
MCV: 88.2 fl (ref 78.0–100.0)
Platelets: 293 K/uL (ref 150.0–400.0)
RBC: 4.52 Mil/uL (ref 3.87–5.11)
RDW: 13.6 % (ref 11.5–15.5)
WBC: 6.2 K/uL (ref 4.0–10.5)

## 2023-04-01 LAB — LIPID PANEL
Cholesterol: 172 mg/dL (ref 0–200)
HDL: 40.1 mg/dL (ref >39.00)
LDL Cholesterol: 96 mg/dL (ref 0–99)
NonHDL: 131.72
Total CHOL/HDL Ratio: 4
Triglycerides: 178 mg/dL — ABNORMAL HIGH (ref 0.0–149.0)
VLDL: 35.6 mg/dL (ref 0.0–40.0)

## 2023-04-01 LAB — COMPREHENSIVE METABOLIC PANEL
ALT: 15 U/L (ref 0–35)
AST: 19 U/L (ref 0–37)
Albumin: 3.8 g/dL (ref 3.5–5.2)
Alkaline Phosphatase: 69 U/L (ref 39–117)
BUN: 12 mg/dL (ref 6–23)
CO2: 28 meq/L (ref 19–32)
Calcium: 9.5 mg/dL (ref 8.4–10.5)
Chloride: 104 meq/L (ref 96–112)
Creatinine, Ser: 0.67 mg/dL (ref 0.40–1.20)
GFR: 77.73 mL/min (ref >60.00)
Glucose, Bld: 189 mg/dL — ABNORMAL HIGH (ref 70–99)
Potassium: 3.7 meq/L (ref 3.5–5.1)
Sodium: 138 meq/L (ref 135–145)
Total Bilirubin: 0.5 mg/dL (ref 0.2–1.2)
Total Protein: 6.7 g/dL (ref 6.0–8.3)

## 2023-04-01 LAB — VITAMIN B12: Vitamin B-12: 473 pg/mL (ref 211–911)

## 2023-04-01 NOTE — Progress Notes (Signed)
Established Patient Office Visit  Subjective:      CC:  Chief Complaint  Patient presents with   Hypertension   Insomnia    HPI: Janice Bush is a 89 y.o. female presenting on 04/01/2023 for Hypertension and Insomnia . DM2: last A1c 8/38 was 7.4. average in the 120's fasting. On metformin XR 500 mg, taking around lunch time rather than am,   Lab Results  Component Value Date   HGBA1C 6.7 (A) 09/27/2022   HLD: followed by Dr. Mariah Milling, cardiology. Taking zetia 10 mg once daily.   HTN: on amlodipine 5 mg once daily and losartan 25 mg once daily.   New complaints:  Episodes, room is spinning and very motion sick. She states typically has an aura before and after vertigo spells, she has decreased vision and linear lines of light. She states she has a vertigo episode about once a week. What works for her at times is to sit up and try to keep her head up all day otherwise symptoms are worse. She will use meclizine which she does find helpful. She had not had episodes in years, but since moving to [place] two years ago she reports four episodes since. She does also find that crystallized ginger is helpful. She has seen the eye doctor with benign findings. Vertigo spells for her have been for several years.   She has had migraines since in her 15's. She would wake up in the am with a horrible headache and throw up and very lethargic. Would stay in bed throughout the duration of it. Would last for about one day and then foggy the next. She would take tylenol   Afib: currently taking eliquis 2.5 mg twice daily, digoxin 0.125 mg daily.  Was on metoprolol but states that she can't remember why. She states she thinks she was having too many episodes of hypotension. She was advised to f/u x one year which is about current day. She will call to make appt.   IDA: was taking iron but stopped once she states the hemoglobin was back to normal.    Social history:  Relevant past medical,  surgical, family and social history reviewed and updated as indicated. Interim medical history since our last visit reviewed.  Allergies and medications reviewed and updated.  DATA REVIEWED: CHART IN EPIC     ROS: Negative unless specifically indicated above in HPI.    Current Outpatient Medications:    Accu-Chek Softclix Lancets lancets, USE TO TEST 4 TIMES DAILY AS DIRECTED., Disp: 100 each, Rfl: 3   amLODipine (NORVASC) 5 MG tablet, TAKE 1 TABLET BY MOUTH DAILY, Disp: 90 tablet, Rfl: 3   apixaban (ELIQUIS) 2.5 MG TABS tablet, Take 1 tablet (2.5 mg total) by mouth 2 (two) times daily., Disp: 60 tablet, Rfl: 5   ascorbic acid (VITAMIN C) 500 MG tablet, Take 500 mg by mouth daily., Disp: , Rfl:    blood glucose meter kit and supplies, Dispense based on patient and insurance preference. Use up to four times daily as directed, Disp: 1 each, Rfl: 0   Blood Glucose Monitoring Suppl DEVI, 1 each by Does not apply route in the morning, at noon, and at bedtime. May substitute to any manufacturer covered by patient's insurance., Disp: 1 each, Rfl: 0   brimonidine (ALPHAGAN) 0.2 % ophthalmic solution, , Disp: , Rfl:    Cholecalciferol (VITAMIN D3 PO), Take 1 tablet by mouth daily., Disp: , Rfl:    clobetasol (TEMOVATE) 0.05 % external solution,  APPLY TO AFFECTED AREAS TWICE DAILY AS DIRECTED, Disp: 50 mL, Rfl: 2   digoxin (LANOXIN) 0.125 MG tablet, Take 1 tablet (125 mcg total) by mouth daily., Disp: 90 tablet, Rfl: 3   dorzolamide-timolol (COSOPT) 2-0.5 % ophthalmic solution, 1 drop 2 (two) times daily., Disp: , Rfl:    ezetimibe (ZETIA) 10 MG tablet, Take 1 tablet (10 mg total) by mouth daily., Disp: 90 tablet, Rfl: 3   fluticasone (FLONASE) 50 MCG/ACT nasal spray, Place 2 sprays into both nostrils daily., Disp: 16 g, Rfl: 6   gabapentin (NEURONTIN) 300 MG capsule, TAKE 1 CAPSULE BY MOUTH EVERY DAY, Disp: 90 capsule, Rfl: 3   latanoprost (XALATAN) 0.005 % ophthalmic solution, 1 drop at bedtime.,  Disp: , Rfl:    LORazepam (ATIVAN) 0.5 MG tablet, TAKE ONE TABLET BY MOUTH AT BEDTIME AS NEEDED FOR SLEEP OR ANXIETY, Disp: 30 tablet, Rfl: 2   losartan (COZAAR) 25 MG tablet, Take 1 tablet (25 mg total) by mouth in the morning and at bedtime., Disp: , Rfl:    metFORMIN (GLUCOPHAGE-XR) 500 MG 24 hr tablet, Take 1 tablet (500 mg total) by mouth daily with breakfast., Disp: 90 tablet, Rfl: 1      Objective:    BP 130/70 (BP Location: Right Arm, Patient Position: Sitting, Cuff Size: Normal)   Pulse 72   Temp 98 F (36.7 C) (Temporal)   Ht 5' 5.5" (1.664 m)   Wt 128 lb 12.8 oz (58.4 kg)   SpO2 98%   BMI 21.11 kg/m   Wt Readings from Last 3 Encounters:  04/01/23 128 lb 12.8 oz (58.4 kg)  02/18/23 127 lb (57.6 kg)  11/05/22 134 lb (60.8 kg)    Physical Exam Constitutional:      General: She is not in acute distress.    Appearance: Normal appearance. She is normal weight. She is not ill-appearing, toxic-appearing or diaphoretic.  HENT:     Head: Normocephalic.  Cardiovascular:     Rate and Rhythm: Normal rate and regular rhythm.  Pulmonary:     Effort: Pulmonary effort is normal.     Breath sounds: Normal breath sounds.  Musculoskeletal:        General: Normal range of motion.  Neurological:     General: No focal deficit present.     Mental Status: She is alert and oriented to person, place, and time. Mental status is at baseline.     Cranial Nerves: Cranial nerves 2-12 are intact.     Sensory: Sensation is intact.     Motor: Motor function is intact.     Coordination: Coordination is intact.     Gait: Gait is intact.  Psychiatric:        Mood and Affect: Mood normal.        Behavior: Behavior normal.        Thought Content: Thought content normal.        Judgment: Judgment normal.           Assessment & Plan:  On statin therapy -     Comprehensive metabolic panel  Paroxysmal atrial fibrillation [facility]) Assessment & Plan: Controlled with xarelto  Pt overdue for  appt with Dr. Mariah Milling annually she will call to make appt.     Essential hypertension  Controlled type 2 diabetes mellitus with complication, without long-term current use of insulin (HCC) Assessment & Plan: Doing well on metformin 500 mg once daily.  Will repeat A1c next visit.     Iron deficiency anemia,  unspecified iron deficiency anemia type Assessment & Plan: Repeat cbc ibc ferritin pending results.  Pt is on xarelto   Orders: -     Vitamin B12 -     CBC -     IBC + Ferritin; Future  Mixed hyperlipidemia Assessment & Plan: Continue zetia 10 mg once daily.  Repeat lipid panel today pending results.    Orders: -     Lipid panel  Low serum vitamin B12 -     Vitamin B12  DOE (dyspnea on exertion) Assessment & Plan: Pt states stable but still occurring regularly.  Dizziness at times.  Pt declines echocardiogram.    Dizziness  Benign paroxysmal positional vertigo due to bilateral vestibular disorder Assessment & Plan: Stable, four times in the last two years.  Advised pt if frequency increases we will discuss further, ENT vs neurology.  Meclizine prn Will also assess b12 status and cbc to r/o IDA      Return in about 6 months (around 09/29/2023) for f/u CPE.  Mort Sawyers, MSN, APRN, FNP-C [facility]

## 2023-04-01 NOTE — Assessment & Plan Note (Signed)
Continue zetia 10 mg once daily.  Repeat lipid panel today pending results.

## 2023-04-01 NOTE — Assessment & Plan Note (Signed)
Pt states stable but still occurring regularly.  Dizziness at times.  Pt declines echocardiogram.

## 2023-04-01 NOTE — Assessment & Plan Note (Signed)
Controlled with xarelto  Pt overdue for appt with Dr. Mariah Milling annually she will call to make appt.

## 2023-04-01 NOTE — Assessment & Plan Note (Signed)
Repeat cbc ibc ferritin pending results.  Pt is on xarelto

## 2023-04-01 NOTE — Assessment & Plan Note (Signed)
Doing well on metformin 500 mg once daily.  Will repeat A1c next visit.

## 2023-04-01 NOTE — Telephone Encounter (Signed)
Please contact pt for future appointment. Pt overdue for follow up.

## 2023-04-01 NOTE — Assessment & Plan Note (Signed)
Stable, four times in the last two years.  Advised pt if frequency increases we will discuss further, ENT vs neurology.  Meclizine prn Will also assess b12 status and cbc to r/o IDA

## 2023-04-02 ENCOUNTER — Telehealth: Payer: Self-pay | Admitting: Cardiovascular Disease

## 2023-04-02 NOTE — Telephone Encounter (Signed)
Left voice mail

## 2023-04-02 NOTE — Telephone Encounter (Signed)
Left voicemail, pt needs to be scheduled for overdue appt from recall

## 2023-04-02 NOTE — Telephone Encounter (Signed)
Pt scheduled on 12/1

## 2023-04-30 ENCOUNTER — Other Ambulatory Visit: Payer: Self-pay | Admitting: Cardiovascular Disease

## 2023-04-30 DIAGNOSIS — H35372 Puckering of macula, left eye: Secondary | ICD-10-CM | POA: Diagnosis not present

## 2023-04-30 DIAGNOSIS — H401133 Primary open-angle glaucoma, bilateral, severe stage: Secondary | ICD-10-CM | POA: Diagnosis not present

## 2023-04-30 DIAGNOSIS — H353122 Nonexudative age-related macular degeneration, left eye, intermediate dry stage: Secondary | ICD-10-CM | POA: Diagnosis not present

## 2023-05-23 ENCOUNTER — Other Ambulatory Visit: Payer: Self-pay | Admitting: Internal Medicine

## 2023-05-23 ENCOUNTER — Other Ambulatory Visit: Payer: Self-pay | Admitting: Family

## 2023-05-23 DIAGNOSIS — F5101 Primary insomnia: Secondary | ICD-10-CM

## 2023-05-24 DIAGNOSIS — H401133 Primary open-angle glaucoma, bilateral, severe stage: Secondary | ICD-10-CM | POA: Diagnosis not present

## 2023-05-24 DIAGNOSIS — H35372 Puckering of macula, left eye: Secondary | ICD-10-CM | POA: Diagnosis not present

## 2023-05-24 DIAGNOSIS — Z0100 Encounter for examination of eyes and vision without abnormal findings: Secondary | ICD-10-CM | POA: Diagnosis not present

## 2023-06-03 DIAGNOSIS — H401133 Primary open-angle glaucoma, bilateral, severe stage: Secondary | ICD-10-CM | POA: Diagnosis not present

## 2023-06-11 ENCOUNTER — Ambulatory Visit: Payer: Medicare PPO | Admitting: Cardiovascular Disease

## 2023-06-12 ENCOUNTER — Other Ambulatory Visit: Payer: Self-pay | Admitting: Family

## 2023-06-18 ENCOUNTER — Other Ambulatory Visit (INDEPENDENT_AMBULATORY_CARE_PROVIDER_SITE_OTHER): Payer: Medicare PPO

## 2023-06-18 DIAGNOSIS — E782 Mixed hyperlipidemia: Secondary | ICD-10-CM

## 2023-06-18 DIAGNOSIS — E118 Type 2 diabetes mellitus with unspecified complications: Secondary | ICD-10-CM | POA: Diagnosis not present

## 2023-06-18 DIAGNOSIS — D509 Iron deficiency anemia, unspecified: Secondary | ICD-10-CM | POA: Diagnosis not present

## 2023-06-18 DIAGNOSIS — E119 Type 2 diabetes mellitus without complications: Secondary | ICD-10-CM | POA: Diagnosis not present

## 2023-06-18 DIAGNOSIS — Z79899 Other long term (current) drug therapy: Secondary | ICD-10-CM | POA: Diagnosis not present

## 2023-06-18 LAB — IBC + FERRITIN
Ferritin: 13.8 ng/mL (ref 10.0–291.0)
Iron: 86 ug/dL (ref 42–145)
Saturation Ratios: 20.5 % (ref 20.0–50.0)
TIBC: 418.6 ug/dL (ref 250.0–450.0)
Transferrin: 299 mg/dL (ref 212.0–360.0)

## 2023-06-18 LAB — HEMOGLOBIN A1C: Hgb A1c MFr Bld: 7.4 % — ABNORMAL HIGH (ref 4.6–6.5)

## 2023-06-19 ENCOUNTER — Other Ambulatory Visit: Payer: Self-pay | Admitting: Family

## 2023-06-19 ENCOUNTER — Other Ambulatory Visit: Payer: Self-pay | Admitting: *Deleted

## 2023-06-19 ENCOUNTER — Ambulatory Visit: Payer: Medicare PPO | Admitting: Family

## 2023-06-19 DIAGNOSIS — F5101 Primary insomnia: Secondary | ICD-10-CM

## 2023-06-19 LAB — DIGOXIN LEVEL: Digoxin Level: 0.5 ug/L — ABNORMAL LOW (ref 0.8–2.0)

## 2023-06-19 MED ORDER — METFORMIN HCL ER 500 MG PO TB24: 500.0000 mg | ORAL_TABLET | Freq: Two times a day (BID) | ORAL | 3 refills | Status: AC

## 2023-06-19 NOTE — Progress Notes (Signed)
Per pt request drew digoxin level.  Do you still want these drawn periodically?

## 2023-06-21 DIAGNOSIS — H401133 Primary open-angle glaucoma, bilateral, severe stage: Secondary | ICD-10-CM | POA: Diagnosis not present

## 2023-06-21 DIAGNOSIS — M3501 Sicca syndrome with keratoconjunctivitis: Secondary | ICD-10-CM | POA: Diagnosis not present

## 2023-06-21 DIAGNOSIS — H35372 Puckering of macula, left eye: Secondary | ICD-10-CM | POA: Diagnosis not present

## 2023-06-21 DIAGNOSIS — H353122 Nonexudative age-related macular degeneration, left eye, intermediate dry stage: Secondary | ICD-10-CM | POA: Diagnosis not present

## 2023-06-28 DIAGNOSIS — H401133 Primary open-angle glaucoma, bilateral, severe stage: Secondary | ICD-10-CM | POA: Diagnosis not present

## 2023-07-05 DIAGNOSIS — H04123 Dry eye syndrome of bilateral lacrimal glands: Secondary | ICD-10-CM | POA: Diagnosis not present

## 2023-07-05 DIAGNOSIS — H0288B Meibomian gland dysfunction left eye, upper and lower eyelids: Secondary | ICD-10-CM | POA: Diagnosis not present

## 2023-07-09 ENCOUNTER — Other Ambulatory Visit: Payer: Self-pay | Admitting: Cardiovascular Disease

## 2023-07-12 ENCOUNTER — Ambulatory Visit: Payer: Self-pay | Admitting: Family

## 2023-07-12 NOTE — Telephone Encounter (Signed)
  Chief Complaint: Urinary Frequency Symptoms: burning and frequency with urination, cloudy urine Frequency: started today Pertinent Negatives: Patient denies fever Disposition: [] ED /[] Urgent Care (no appt availability in office) /Appointment(In office/virtual)/ [x]  [facility]/ [] Home Care/ [] Refused Recommended Disposition /[] [facility]/ []  Follow-up with PCP Additional Notes: patient with recent hx of UTI called with concerns for a new UTI with symptoms of urinary frequency, burning and cloudy urine. Patient states she checked her urine with a home test-positive for leukocytes, no nitrates. Patient states she has been having issues with her eye sight and unable to drive. Video Visit is given as an option-pt states she can do that. Education given on video visit. Video Visit set up for patient for 07/13/2023 at 10:15 am. Patient verbalized understanding and all questions answered.     Copied from CRM [id number]. Topic: Clinical - Red Word Triage >> Jul 12, 2023  4:59 PM Fuller Mandril wrote: Red Word that prompted transfer to Nurse Triage: Burning frequent urination - at home test positive. Bad eye sight unable to drive. Reason for Disposition  Urinating more frequently than usual (i.e., frequency)  Answer Assessment - Initial Assessment Questions 1. SYMPTOM: "What's the main symptom you're concerned about?" (e.g., frequency, incontinence)     Urinary frequency 2. ONSET: "When did the  urinary frequency  start?"     This afternoon-burning and urinary frequency 3. PAIN: "Is there any pain?" If Yes, ask: "How bad is it?" (Scale: 1-10; mild, moderate, severe)     4 out of 10 4. CAUSE: "What do you think is causing the symptoms?"     UTI 5. OTHER SYMPTOMS: "Do you have any other symptoms?" (e.g., blood in urine, fever, flank pain, pain with urination)     Burning with urination  Protocols used: Urinary Symptoms-A-AH

## 2023-07-13 ENCOUNTER — Telehealth: Payer: Medicare PPO | Admitting: Family Medicine

## 2023-07-13 DIAGNOSIS — N390 Urinary tract infection, site not specified: Secondary | ICD-10-CM

## 2023-07-13 MED ORDER — NITROFURANTOIN MONOHYD MACRO 100 MG PO CAPS: 100.0000 mg | ORAL_CAPSULE | Freq: Two times a day (BID) | ORAL | 0 refills | Status: AC

## 2023-07-13 NOTE — Progress Notes (Signed)
 Virtual Visit Consent   Janice Bush, you are scheduled for a virtual visit with a [facility] provider today. Just as with appointments in the office, your consent must be obtained to participate. Your consent will be active for this visit and any virtual visit you may have with one of our providers in the next 365 days. If you have a MyChart account, a copy of this consent can be sent to you electronically.  As this is a virtual visit, video technology does not allow for your provider to perform a traditional examination. This may limit your provider's ability to fully assess your condition. If your provider identifies any concerns that need to be evaluated in person or the need to arrange testing (such as labs, EKG, etc.), we will make arrangements to do so. Although advances in technology are sophisticated, we cannot ensure that it will always work on either your end or our end. If the connection with a video visit is poor, the visit may have to be switched to a telephone visit. With either a video or telephone visit, we are not always able to ensure that we have a secure connection.  By engaging in this virtual visit, you consent to the provision of healthcare and authorize for your insurance to be billed (if applicable) for the services provided during this visit. Depending on your insurance coverage, you may receive a charge related to this service.  I need to obtain your verbal consent now. Are you willing to proceed with your visit today? Janice Bush has provided verbal consent on 07/13/2023 for a virtual visit (video or telephone). Georgana Curio, FNP  Date: 07/13/2023 10:20 AM   Virtual Visit via Video Note   I, Georgana Curio, connected with  Janice Bush  ([id number], [birth year]) on 07/13/23 at 10:15 AM EST by a video-enabled telemedicine application and verified that I am speaking with the correct person using two identifiers.  Location: Patient: Virtual Visit Location Patient:  Home Provider: Virtual Visit Location Provider: Home Office   I discussed the limitations of evaluation and management by telemedicine and the availability of in person appointments. The patient expressed understanding and agreed to proceed.    History of Present Illness: Janice Bush is a 89 y.o. who identifies as a female who was assigned female at birth, and is being seen today for burning and frequency on urination. No fever. No abd pain. Marland Kitchen  HPI: HPI  Problems:  Patient Active Problem List   Diagnosis Date Noted   On statin therapy 04/01/2023   Controlled type 2 diabetes mellitus with complication, without long-term current use of insulin (HCC) 09/27/2022   Arthritis 09/27/2022   DOE (dyspnea on exertion) 12/11/2021   Ocular migraine 09/26/2021   Glaucoma of both eyes 09/26/2021   Iron deficiency anemia 09/26/2021   Allergic rhinitis due to pollen 09/26/2021   History of endometrial cancer 09/26/2021   Atrophic vaginitis 09/26/2021   Chronic lumbosacral pain 02/14/2016   BPPV (benign paroxysmal positional vertigo) 11/19/2012   Insomnia 11/19/2012   Hyperlipidemia 11/19/2012   Essential hypertension 04/11/2001   Osteoarthrosis 04/11/2001   Paroxysmal atrial fibrillation (HCC) 04/11/2001    Allergies:  Allergies  Allergen Reactions   Tramadol Other (See Comments)    Felt groggy and confused for a few days after taking one dose.   Diphenhydramine Hcl Anxiety   Medications:  Current Outpatient Medications:    Accu-Chek Softclix Lancets lancets, USE TO TEST 4 TIMES DAILY AS DIRECTED., Disp: 100 each, Rfl: 3  amLODipine (NORVASC) 5 MG tablet, TAKE 1 TABLET BY MOUTH DAILY, Disp: 90 tablet, Rfl: 1   apixaban (ELIQUIS) 2.5 MG TABS tablet, Take 1 tablet (2.5 mg total) by mouth 2 (two) times daily., Disp: 60 tablet, Rfl: 5   ascorbic acid (VITAMIN C) 500 MG tablet, Take 500 mg by mouth daily., Disp: , Rfl:    blood glucose meter kit and supplies, Dispense based on patient and  insurance preference. Use up to four times daily as directed, Disp: 1 each, Rfl: 0   Blood Glucose Monitoring Suppl DEVI, 1 each by Does not apply route in the morning, at noon, and at bedtime. May substitute to any manufacturer covered by patient's insurance., Disp: 1 each, Rfl: 0   brimonidine (ALPHAGAN) 0.2 % ophthalmic solution, , Disp: , Rfl:    Cholecalciferol (VITAMIN D3 PO), Take 1 tablet by mouth daily., Disp: , Rfl:    clobetasol (TEMOVATE) 0.05 % external solution, APPLY TO AFFECTED AREAS TWICE DAILY AS DIRECTED, Disp: 50 mL, Rfl: 2   digoxin (LANOXIN) 0.125 MG tablet, TAKE 1 TABLET BY MOUTH DAILY, Disp: 90 tablet, Rfl: 0   dorzolamide-timolol (COSOPT) 2-0.5 % ophthalmic solution, 1 drop 2 (two) times daily., Disp: , Rfl:    ezetimibe (ZETIA) 10 MG tablet, TAKE 1 TABLET BY MOUTH DAILY, Disp: 90 tablet, Rfl: 0   fluticasone (FLONASE) 50 MCG/ACT nasal spray, Place 2 sprays into both nostrils daily., Disp: 16 g, Rfl: 6   gabapentin (NEURONTIN) 300 MG capsule, TAKE 1 CAPSULE BY MOUTH EVERY DAY, Disp: 90 capsule, Rfl: 3   latanoprost (XALATAN) 0.005 % ophthalmic solution, 1 drop at bedtime., Disp: , Rfl:    LORazepam (ATIVAN) 0.5 MG tablet, TAKE ONE TABLET BY MOUTH AT BEDTIME AS NEEDED FOR SLEEP OR ANXIETY, Disp: 30 tablet, Rfl: 2   losartan (COZAAR) 25 MG tablet, Take 1 tablet (25 mg total) by mouth in the morning and at bedtime., Disp: , Rfl:    metFORMIN (GLUCOPHAGE-XR) 500 MG 24 hr tablet, Take 1 tablet (500 mg total) by mouth 2 (two) times daily with a meal., Disp: 180 tablet, Rfl: 3  Observations/Objective: Patient is well-developed, well-nourished in no acute distress.  Resting comfortably  at home.  Head is normocephalic, atraumatic.  No labored breathing.  Speech is clear and coherent with logical content.  Patient is alert and oriented at baseline.    Assessment and Plan: 1. Urinary tract infection without hematuria, site unspecified (Primary)  Increase fluids, prevention,  follow up with pcp this week. Can't drive due to dry eyes.   Follow Up Instructions: I discussed the assessment and treatment plan with the patient. The patient was provided an opportunity to ask questions and all were answered. The patient agreed with the plan and demonstrated an understanding of the instructions.  A copy of instructions were sent to the patient via MyChart unless otherwise noted below.     The patient was advised to call back or seek an in-person evaluation if the symptoms worsen or if the condition fails to improve as anticipated.    Georgana Curio, FNP

## 2023-07-13 NOTE — Patient Instructions (Signed)

## 2023-07-16 ENCOUNTER — Other Ambulatory Visit: Payer: Self-pay | Admitting: Family

## 2023-07-16 ENCOUNTER — Encounter: Payer: Self-pay | Admitting: Family

## 2023-07-16 DIAGNOSIS — R350 Frequency of micturition: Secondary | ICD-10-CM

## 2023-07-22 NOTE — Progress Notes (Unsigned)
 Cardiology Office Note  Date:  07/23/2023   ID:  Janice Bush, DOB Aug 22, 1931, MRN [id number]  PCP:  Mort Sawyers, FNP   Chief Complaint  Patient presents with   12 month follow up     "Doing well."  Patient would like to know if it's okay to take fish oil.     HPI:  [name] is a 89 year old woman with past medical history of Anemia, on iron Scoliosis/sciatica Atrial fibrillation, likely permanent Hypertension, hyperlipidemia Who presents for follow-up of her atrial fibrillation  Last seen in clinic  November 2023  In follow-up today reports that she lives at [facility] of [employer] well, active, goes walking Working on her balance  On Eliquis 2.5 twice daily reduced dose 60 kg age over 66,   digoxin 0.125 daily, digoxin level 0.5  previously on metoprolol, felt she was having side effects, this was held Appreciate atrial fibrillation, feels her rate is well-controlled Denies significant shortness of breath or chest discomfort on exertion  Reports that she is watching her diet Reports family history of adult onset diabetes, brother had it Does not drink sugary drinks, watching her desserts On metformin twice a day  Lab work reviewed  Creatinine 0.61 potassium 3.6 Digoxin 0.5 A1c trending upwards 7.4 up from 6.7 Hemoglobin 13.1 Total cholesterol 172 down from 242 LDL 96 down from 160  No new complaints overall feels well  EKG personally reviewed by myself on todays visit EKG Interpretation Date/Time:  Tuesday July 23 2023 13:43:17 EST Ventricular Rate:  75 PR Interval:    QRS Duration:  80 QT Interval:  386 QTC Calculation: 431 R Axis:   -46  Text Interpretation: Atrial fibrillation Left axis deviation Septal infarct , age undetermined Inferior infarct , age undetermined When compared with ECG of 30-May-2021 11:04, Inferior infarct is now Present QT has lengthened Confirmed by Julien Nordmann [phone]) on 07/23/2023 1:53:51 PM    Other  past medical history reviewed EKG at [facility] March 30, 2021 showing atrial fibrillation rate 82 bpm left bundle branch block  Echocardiogram 2017 ejection fraction greater than 55% normal left atrial size   PMH:   has a past medical history of Afib (HCC), Allergy, Arthritis, Cancer (HCC), Glaucoma, Heart murmur, Hyperlipidemia, Hypertension, Migraine without status migrainosus, not intractable (11/19/2012), Migraines, and Uterine cancer (HCC) (2012).  PSH:    Past Surgical History:  Procedure Laterality Date   BREAST EXCISIONAL BIOPSY Left    age [age]   ROBOTIC ASSISTED LAP VAGINAL HYSTERECTOMY N/A    Full hysterectomy per patient was not abdominal was robotic.   TONSILLECTOMY AND ADENOIDECTOMY      Current Outpatient Medications  Medication Sig Dispense Refill   Accu-Chek Softclix Lancets lancets USE TO TEST 4 TIMES DAILY AS DIRECTED. 100 each 3   amLODipine (NORVASC) 5 MG tablet TAKE 1 TABLET BY MOUTH DAILY 90 tablet 1   apixaban (ELIQUIS) 2.5 MG TABS tablet Take 1 tablet (2.5 mg total) by mouth 2 (two) times daily. 60 tablet 5   ascorbic acid (VITAMIN C) 500 MG tablet Take 500 mg by mouth daily.     blood glucose meter kit and supplies Dispense based on patient and insurance preference. Use up to four times daily as directed 1 each 0   Blood Glucose Monitoring Suppl DEVI 1 each by Does not apply route in the morning, at noon, and at bedtime. May substitute to any manufacturer covered by patient's insurance. 1 each 0   brimonidine (ALPHAGAN) 0.2 %  ophthalmic solution      Cholecalciferol (VITAMIN D3 PO) Take 1 tablet by mouth daily.     clobetasol (TEMOVATE) 0.05 % external solution APPLY TO AFFECTED AREAS TWICE DAILY AS DIRECTED 50 mL 2   cycloSPORINE (RESTASIS) 0.05 % ophthalmic emulsion Place 1 drop into both eyes 2 (two) times daily.     digoxin (LANOXIN) 0.125 MG tablet TAKE 1 TABLET BY MOUTH DAILY 90 tablet 0   dorzolamide-timolol (COSOPT) 2-0.5 % ophthalmic solution 1 drop 2  (two) times daily.     ezetimibe (ZETIA) 10 MG tablet TAKE 1 TABLET BY MOUTH DAILY 90 tablet 0   gabapentin (NEURONTIN) 300 MG capsule TAKE 1 CAPSULE BY MOUTH EVERY DAY 90 capsule 3   latanoprost (XALATAN) 0.005 % ophthalmic solution 1 drop at bedtime.     LORazepam (ATIVAN) 0.5 MG tablet TAKE ONE TABLET BY MOUTH AT BEDTIME AS NEEDED FOR SLEEP OR ANXIETY 30 tablet 2   losartan (COZAAR) 25 MG tablet Take 1 tablet (25 mg total) by mouth in the morning and at bedtime.     metFORMIN (GLUCOPHAGE-XR) 500 MG 24 hr tablet Take 1 tablet (500 mg total) by mouth 2 (two) times daily with a meal. 180 tablet 3   No current facility-administered medications for this visit.   Allergies:   Tramadol and Diphenhydramine hcl   Social History:  The patient  reports that she has never smoked. She has never used smokeless tobacco. She reports that she does not drink alcohol and does not use drugs.   Family History:   family history includes Alcohol abuse in her father; Diabetes in her brother; Early death in her brother, brother, father, and mother; Heart attack (age of onset: 49) in her father; Heart attack (age of onset: 42) in her brother; Heart attack (age of onset: 39) in her brother; Hyperlipidemia in her brother; Hypertension in her brother and mother; Lung cancer in her mother; Other in her paternal grandfather.   Review of Systems: Review of Systems  Constitutional: Negative.   HENT: Negative.    Respiratory: Negative.    Cardiovascular: Negative.   Gastrointestinal: Negative.   Musculoskeletal: Negative.   Neurological: Negative.   Psychiatric/Behavioral: Negative.    All other systems reviewed and are negative.   PHYSICAL EXAM: VS:  BP 100/60 (BP Location: Left Arm, Patient Position: Sitting, Cuff Size: Normal)   Pulse 75   Ht 5\' 5"  (1.651 m)   Wt 127 lb (57.6 kg)   SpO2 96%   BMI 21.13 kg/m  , BMI Body mass index is 21.13 kg/m. Constitutional:  oriented to person, place, and time. No  distress.  HENT:  Head: Grossly normal Eyes:  no discharge. No scleral icterus.  Neck: No JVD, no carotid bruits  Cardiovascular: Regular rate and rhythm, no murmurs appreciated Pulmonary/Chest: Clear to auscultation bilaterally, no wheezes or rails Abdominal: Soft.  no distension.  no tenderness.  Musculoskeletal: Normal range of motion Neurological:  normal muscle tone. Coordination normal. No atrophy Skin: Skin warm and dry Psychiatric: normal affect, pleasant  Recent Labs: 04/01/2023: ALT 15; BUN 12; Creatinine, Ser 0.67; Hemoglobin 13.1; Platelets 293.0; Potassium 3.7; Sodium 138    Lipid Panel Lab Results  Component Value Date   CHOL 172 04/01/2023   HDL 40.10 04/01/2023   LDLCALC 96 04/01/2023   TRIG 178.0 (H) 04/01/2023      Wt Readings from Last 3 Encounters:  07/23/23 127 lb (57.6 kg)  04/01/23 128 lb 12.8 oz (58.4 kg)  02/18/23  127 lb (57.6 kg)     ASSESSMENT AND PLAN:  Problem List Items Addressed This Visit       Cardiology Problems   Hyperlipidemia   Paroxysmal atrial fibrillation (HCC) - Primary   Relevant Orders   EKG 12-Lead (Completed)   Essential hypertension   Relevant Orders   EKG 12-Lead (Completed)     Other   DOE (dyspnea on exertion)   Relevant Orders   EKG 12-Lead (Completed)   Controlled type 2 diabetes mellitus with complication, without long-term current use of insulin (HCC)   Permanent atrial fibrillation On Eliquis 2.5 twice daily, reduced dose digoxin for rate control Metoprolol previously held by primary care at [facility] as it was felt she was on too many pills No recent falls, denies TIA or stroke symptoms Rate relatively well-controlled  PVCs Asymptomatic Previously noted on EKG No recent symptoms  Essential hypertension Blood pressure is well controlled on today's visit. No changes made to the medications.  Hyperlipidemia She is concerned about side effects of statins, prefers not to be on statins Nice drop in  cholesterol on Zetia 10 mg daily alone LDL less than 100     Signed, Dossie Arbour, M.D., Ph.D. [facility] [place], [place] [phone]

## 2023-07-23 ENCOUNTER — Encounter: Payer: Self-pay | Admitting: Cardiovascular Disease

## 2023-07-23 ENCOUNTER — Ambulatory Visit: Payer: Medicare PPO | Attending: Cardiovascular Disease | Admitting: Cardiovascular Disease

## 2023-07-23 VITALS — BP 100/60 | HR 75 | Ht 65.0 in | Wt 127.0 lb

## 2023-07-23 DIAGNOSIS — R0609 Other forms of dyspnea: Secondary | ICD-10-CM | POA: Diagnosis not present

## 2023-07-23 DIAGNOSIS — I10 Essential (primary) hypertension: Secondary | ICD-10-CM

## 2023-07-23 DIAGNOSIS — I480 Paroxysmal atrial fibrillation: Secondary | ICD-10-CM

## 2023-07-23 DIAGNOSIS — E118 Type 2 diabetes mellitus with unspecified complications: Secondary | ICD-10-CM | POA: Diagnosis not present

## 2023-07-23 DIAGNOSIS — E782 Mixed hyperlipidemia: Secondary | ICD-10-CM | POA: Diagnosis not present

## 2023-07-23 MED ORDER — EZETIMIBE 10 MG PO TABS: 10.0000 mg | ORAL_TABLET | Freq: Every day | ORAL | 3 refills | Status: AC

## 2023-07-23 MED ORDER — DIGOXIN 125 MCG PO TABS: 125.0000 ug | ORAL_TABLET | Freq: Every day | ORAL | 3 refills | Status: AC

## 2023-07-23 NOTE — Patient Instructions (Signed)

## 2023-08-23 ENCOUNTER — Other Ambulatory Visit: Payer: Self-pay | Admitting: Cardiovascular Disease

## 2023-08-23 DIAGNOSIS — I480 Paroxysmal atrial fibrillation: Secondary | ICD-10-CM

## 2023-08-23 NOTE — Telephone Encounter (Signed)
 Prescription refill request for Eliquis received. Indication: Afib  Last office visit:07/23/23 (Gollan)  Scr: 0.67 (04/01/23) Age: 89 Weight: 57.6kg  Appropriate dose. Refill sent.

## 2023-09-11 ENCOUNTER — Other Ambulatory Visit: Payer: Self-pay | Admitting: Family

## 2023-09-11 DIAGNOSIS — F5101 Primary insomnia: Secondary | ICD-10-CM

## 2023-10-11 DIAGNOSIS — H401133 Primary open-angle glaucoma, bilateral, severe stage: Secondary | ICD-10-CM | POA: Diagnosis not present

## 2023-10-16 ENCOUNTER — Encounter: Payer: Self-pay | Admitting: Family

## 2023-10-16 ENCOUNTER — Ambulatory Visit: Payer: Medicare PPO | Admitting: Family

## 2023-10-16 VITALS — BP 144/78 | HR 81 | Temp 98.0°F | Ht 65.0 in | Wt 124.0 lb

## 2023-10-16 DIAGNOSIS — D508 Other iron deficiency anemias: Secondary | ICD-10-CM

## 2023-10-16 DIAGNOSIS — N3944 Nocturnal enuresis: Secondary | ICD-10-CM

## 2023-10-16 DIAGNOSIS — J301 Allergic rhinitis due to pollen: Secondary | ICD-10-CM | POA: Diagnosis not present

## 2023-10-16 DIAGNOSIS — I10 Essential (primary) hypertension: Secondary | ICD-10-CM

## 2023-10-16 DIAGNOSIS — E782 Mixed hyperlipidemia: Secondary | ICD-10-CM | POA: Diagnosis not present

## 2023-10-16 DIAGNOSIS — R519 Headache, unspecified: Secondary | ICD-10-CM | POA: Diagnosis not present

## 2023-10-16 DIAGNOSIS — E118 Type 2 diabetes mellitus with unspecified complications: Secondary | ICD-10-CM

## 2023-10-16 DIAGNOSIS — I480 Paroxysmal atrial fibrillation: Secondary | ICD-10-CM | POA: Diagnosis not present

## 2023-10-16 DIAGNOSIS — M6289 Other specified disorders of muscle: Secondary | ICD-10-CM

## 2023-10-16 LAB — COMPREHENSIVE METABOLIC PANEL WITH GFR
ALT: 13 U/L (ref 0–35)
AST: 15 U/L (ref 0–37)
Albumin: 4.1 g/dL (ref 3.5–5.2)
Alkaline Phosphatase: 66 U/L (ref 39–117)
BUN: 14 mg/dL (ref 6–23)
CO2: 31 meq/L (ref 19–32)
Calcium: 9.9 mg/dL (ref 8.4–10.5)
Chloride: 102 meq/L (ref 96–112)
Creatinine, Ser: 0.6 mg/dL (ref 0.40–1.20)
GFR: 79.52 mL/min (ref >60.00)
Glucose, Bld: 159 mg/dL — ABNORMAL HIGH (ref 70–99)
Potassium: 3.7 meq/L (ref 3.5–5.1)
Sodium: 138 meq/L (ref 135–145)
Total Bilirubin: 0.6 mg/dL (ref 0.2–1.2)
Total Protein: 6.5 g/dL (ref 6.0–8.3)

## 2023-10-16 LAB — LIPID PANEL
Cholesterol: 183 mg/dL (ref 0–200)
HDL: 45.9 mg/dL (ref >39.00)
LDL Cholesterol: 101 mg/dL — ABNORMAL HIGH (ref 0–99)
NonHDL: 137.53
Total CHOL/HDL Ratio: 4
Triglycerides: 181 mg/dL — ABNORMAL HIGH (ref 0.0–149.0)
VLDL: 36.2 mg/dL (ref 0.0–40.0)

## 2023-10-16 LAB — CBC
HCT: 39 % (ref 36.0–46.0)
Hemoglobin: 12.8 g/dL (ref 12.0–15.0)
MCHC: 32.8 g/dL (ref 30.0–36.0)
MCV: 83.8 fl (ref 78.0–100.0)
Platelets: 299 10*3/uL (ref 150.0–400.0)
RBC: 4.65 Mil/uL (ref 3.87–5.11)
RDW: 14.5 % (ref 11.5–15.5)
WBC: 6.5 10*3/uL (ref 4.0–10.5)

## 2023-10-16 LAB — MICROALBUMIN / CREATININE URINE RATIO
Creatinine,U: 19.4 mg/dL
Microalb Creat Ratio: 53.8 mg/g — ABNORMAL HIGH (ref 0.0–30.0)
Microalb, Ur: 1 mg/dL (ref 0.0–1.9)

## 2023-10-16 LAB — SEDIMENTATION RATE: Sed Rate: 24 mm/h (ref 0–30)

## 2023-10-16 LAB — TSH: TSH: 2.59 u[IU]/mL (ref 0.35–5.50)

## 2023-10-16 LAB — C-REACTIVE PROTEIN: CRP: <1 mg/dL (ref 0.5–20.0)

## 2023-10-16 LAB — HEMOGLOBIN A1C: Hgb A1c MFr Bld: 7 % — ABNORMAL HIGH (ref 4.6–6.5)

## 2023-10-16 NOTE — Progress Notes (Signed)
 Established Patient Office Visit  Subjective:      CC:  Chief Complaint  Patient presents with   Medical Management of Chronic Issues    F/u   Dizziness   Blurred Vision   Fatigue    HPI: Janice Bush is a 89 y.o. female presenting on 10/16/2023 for Medical Management of Chronic Issues (F/u), Dizziness, Blurred Vision, and Fatigue  Recently saw cardiology for f/u of her afib, seen on 07/23/23, per note pt to continue on eliquis  2.5 mg twice daily, had reduced digoxin  dose, on zetia  for cholesterol 10 mg LDL less than 100.   Urinary incontinence, having to wear a pad daily which is more than usual. She was interested in pelvic floor therapy. Pt does not have contact info but she wanted to be referred to [facility] for pelvic floor therapy, she will get me the name.  Waking her up at night this is when it is the worse, and she is waking up and then having trouble falling asleep.   Diabetes: last A1c at 7.4. has been working on her diabetic diet, cut down on sweets.   New complaints:  She gets a 'migraine syndrome' she does have nasal congestion/sinus pressure. She states when these come on she gets dizzy and makes it hard for her to drive. This will typically last for a day or two. She does feel as though she has a 'headache behind her eyes'. She states she has blurry vision when she has migraines. She is following with the eye doctor. She does notice the vertigo symptoms more so when she lies down flat.      Social history:  Relevant past medical, surgical, family and social history reviewed and updated as indicated. Interim medical history since our last visit reviewed.  Allergies and medications reviewed and updated.  DATA REVIEWED: CHART IN EPIC     ROS: Negative unless specifically indicated above in HPI.    Current Outpatient Medications:    Accu-Chek Softclix Lancets lancets, USE TO TEST 4 TIMES DAILY AS DIRECTED., Disp: 100 each, Rfl: 3   amLODipine  (NORVASC) 5 MG tablet, TAKE 1 TABLET BY MOUTH DAILY, Disp: 90 tablet, Rfl: 1   apixaban  (ELIQUIS ) 2.5 MG TABS tablet, TAKE ONE TABLET (2.5 MG) BY MOUTH TWICE DAILY, Disp: 180 tablet, Rfl: 1   ascorbic acid (VITAMIN C) 500 MG tablet, Take 500 mg by mouth daily., Disp: , Rfl:    blood glucose meter kit and supplies, Dispense based on patient and insurance preference. Use up to four times daily as directed, Disp: 1 each, Rfl: 0   Blood Glucose Monitoring Suppl DEVI, 1 each by Does not apply route in the morning, at noon, and at bedtime. May substitute to any manufacturer covered by patient's insurance., Disp: 1 each, Rfl: 0   brimonidine (ALPHAGAN) 0.2 % ophthalmic solution, Place 1 drop into both eyes in the morning and at bedtime., Disp: , Rfl:    Cholecalciferol (VITAMIN D3 PO), Take 1 tablet by mouth daily., Disp: , Rfl:    clobetasol  (TEMOVATE ) 0.05 % external solution, APPLY TO AFFECTED AREAS TWICE DAILY AS DIRECTED, Disp: 50 mL, Rfl: 2   digoxin  (LANOXIN ) 0.125 MG tablet, Take 1 tablet (125 mcg total) by mouth daily., Disp: 90 tablet, Rfl: 3   dorzolamide-timolol (COSOPT) 2-0.5 % ophthalmic solution, 1 drop 2 (two) times daily., Disp: , Rfl:    ezetimibe  (ZETIA ) 10 MG tablet, Take 1 tablet (10 mg total) by mouth daily., Disp: 90 tablet,  Rfl: 3   gabapentin  (NEURONTIN ) 300 MG capsule, TAKE 1 CAPSULE BY MOUTH EVERY DAY, Disp: 90 capsule, Rfl: 3   latanoprost (XALATAN) 0.005 % ophthalmic solution, Place 1 drop into both eyes at bedtime., Disp: , Rfl:    LORazepam  (ATIVAN ) 0.5 MG tablet, TAKE ONE TABLET BY MOUTH AT BEDTIME AS NEEDED FOR SLEEP OR ANXIETY, Disp: 30 tablet, Rfl: 2   losartan (COZAAR) 25 MG tablet, Take 1 tablet (25 mg total) by mouth in the morning and at bedtime., Disp: , Rfl:    metFORMIN  (GLUCOPHAGE -XR) 500 MG 24 hr tablet, Take 1 tablet (500 mg total) by mouth 2 (two) times daily with a meal., Disp: 180 tablet, Rfl: 3   Multiple Vitamins-Minerals (PRESERVISION AREDS 2) CAPS, Take 1  capsule by mouth daily., Disp: , Rfl:       Objective:     BP (!) 144/78 (BP Location: Left Arm, Patient Position: Sitting, Cuff Size: Normal)   Pulse 81   Temp 98 F (36.7 C) (Oral)   Ht 5\' 5"  (1.651 m)   Wt 124 lb (56.2 kg)   SpO2 98%   BMI 20.63 kg/m   Wt Readings from Last 3 Encounters:  10/16/23 124 lb (56.2 kg)  07/23/23 127 lb (57.6 kg)  04/01/23 128 lb 12.8 oz (58.4 kg)    Physical Exam        Assessment & Plan:  Nocturnal enuresis Assessment & Plan: Affecting sleep  Will refer to pelvic floor therapy pending pt to let me know her desired place for referral Did advise pt that she may also need to consider urology referral to rule out physical causes pt states will try pt first then consider urology referral.    Essential hypertension -     Microalbumin / creatinine urine ratio -     TSH  Other iron deficiency anemia -     IBC + Ferritin; Future -     CBC  Controlled type 2 diabetes mellitus with complication, without long-term current use of insulin (HCC) -     Hemoglobin A1c -     Comprehensive metabolic panel with GFR  Mixed hyperlipidemia -     Lipid panel  Sinus headache -     Sedimentation rate -     C-reactive protein  Paroxysmal atrial fibrillation (HCC) Assessment & Plan: Continue with cardiology  Continue eliquis  2.5 mg twice daily, digoxin  and zetia  as prescribed. Will continue to monitor cholesterol levels.      Seasonal allergic rhinitis due to pollen Assessment & Plan: Suspect dizziness due to allergies.  Pt with glaucoma, advised safest option would be allegra if needed for allergies Rise slowly upon standing Nasal saline spray prn        Return in about 1 month (around 11/16/2023) for follow up headache.  Felicita Horns, MSN, APRN, FNP-C [facility]

## 2023-10-16 NOTE — Patient Instructions (Signed)
  You can try allegra as this has the least issue with afib and or with your glaucoma. Of course double check with your eye doctor as well but should be ok.

## 2023-10-17 ENCOUNTER — Ambulatory Visit: Payer: Self-pay | Admitting: Family

## 2023-10-20 NOTE — Assessment & Plan Note (Signed)
 Continue with cardiology  Continue eliquis  2.5 mg twice daily, digoxin  and zetia  as prescribed. Will continue to monitor cholesterol levels.

## 2023-10-20 NOTE — Assessment & Plan Note (Signed)
 Affecting sleep  Will refer to pelvic floor therapy pending pt to let me know her desired place for referral Did advise pt that she may also need to consider urology referral to rule out physical causes pt states will try pt first then consider urology referral.

## 2023-10-20 NOTE — Assessment & Plan Note (Signed)
 Suspect dizziness due to allergies.  Pt with glaucoma, advised safest option would be allegra if needed for allergies Rise slowly upon standing Nasal saline spray prn

## 2023-10-21 NOTE — Telephone Encounter (Signed)
 Could you please place a referral to be faxed over for pelvic floor therapy?  Referral would be for amb ref physical therapy with the below added in (per pt request with location) then fax to the # she put into place please)

## 2023-10-22 NOTE — Telephone Encounter (Signed)
 Referral has been placed and faxed the number provided by the patient.

## 2023-10-25 DIAGNOSIS — H401133 Primary open-angle glaucoma, bilateral, severe stage: Secondary | ICD-10-CM | POA: Diagnosis not present

## 2023-10-25 DIAGNOSIS — R278 Other lack of coordination: Secondary | ICD-10-CM | POA: Diagnosis not present

## 2023-10-25 DIAGNOSIS — M6289 Other specified disorders of muscle: Secondary | ICD-10-CM | POA: Diagnosis not present

## 2023-10-25 DIAGNOSIS — N3941 Urge incontinence: Secondary | ICD-10-CM | POA: Diagnosis not present

## 2023-10-25 DIAGNOSIS — R350 Frequency of micturition: Secondary | ICD-10-CM | POA: Diagnosis not present

## 2023-10-28 DIAGNOSIS — R350 Frequency of micturition: Secondary | ICD-10-CM | POA: Diagnosis not present

## 2023-10-28 DIAGNOSIS — N3941 Urge incontinence: Secondary | ICD-10-CM | POA: Diagnosis not present

## 2023-10-28 DIAGNOSIS — R278 Other lack of coordination: Secondary | ICD-10-CM | POA: Diagnosis not present

## 2023-10-28 DIAGNOSIS — M6289 Other specified disorders of muscle: Secondary | ICD-10-CM | POA: Diagnosis not present

## 2023-11-04 DIAGNOSIS — H0100A Unspecified blepharitis right eye, upper and lower eyelids: Secondary | ICD-10-CM | POA: Diagnosis not present

## 2023-11-04 DIAGNOSIS — H119 Unspecified disorder of conjunctiva: Secondary | ICD-10-CM | POA: Diagnosis not present

## 2023-11-04 DIAGNOSIS — H04123 Dry eye syndrome of bilateral lacrimal glands: Secondary | ICD-10-CM | POA: Diagnosis not present

## 2023-11-04 DIAGNOSIS — H0100B Unspecified blepharitis left eye, upper and lower eyelids: Secondary | ICD-10-CM | POA: Diagnosis not present

## 2023-11-04 DIAGNOSIS — H0288B Meibomian gland dysfunction left eye, upper and lower eyelids: Secondary | ICD-10-CM | POA: Diagnosis not present

## 2023-11-04 DIAGNOSIS — H0288A Meibomian gland dysfunction right eye, upper and lower eyelids: Secondary | ICD-10-CM | POA: Diagnosis not present

## 2023-11-19 ENCOUNTER — Other Ambulatory Visit: Payer: Self-pay | Admitting: Family

## 2023-11-19 ENCOUNTER — Ambulatory Visit: Admitting: Family

## 2023-11-19 ENCOUNTER — Encounter: Payer: Self-pay | Admitting: Family

## 2023-11-19 VITALS — BP 126/74 | HR 88 | Resp 16 | Ht 65.0 in | Wt 122.0 lb

## 2023-11-19 DIAGNOSIS — N3944 Nocturnal enuresis: Secondary | ICD-10-CM

## 2023-11-19 DIAGNOSIS — N3941 Urge incontinence: Secondary | ICD-10-CM

## 2023-11-19 DIAGNOSIS — J301 Allergic rhinitis due to pollen: Secondary | ICD-10-CM | POA: Diagnosis not present

## 2023-11-19 DIAGNOSIS — G8929 Other chronic pain: Secondary | ICD-10-CM

## 2023-11-19 MED ORDER — FEXOFENADINE HCL 60 MG PO TABS
60.0000 mg | ORAL_TABLET | Freq: Every day | ORAL | 0 refills | Status: AC
Start: 2023-11-19 — End: ?

## 2023-11-19 NOTE — Assessment & Plan Note (Signed)
 Referral to urologist  Newer worsening onset  Some slight improvement with pelvic floor therapy but not completely.

## 2023-11-19 NOTE — Patient Instructions (Signed)
  A referral was placed today for urology.   Please let us know if you have not heard back within 2 weeks about the referral.

## 2023-11-19 NOTE — Assessment & Plan Note (Signed)
 Advised can take allegra if needed safer option with glaucoma.  Advised to use 60 mg if needed RX sent.  Advised to stay off unless allergic rhinitis/headache behind the eyes occurs.

## 2023-11-19 NOTE — Progress Notes (Signed)
 Established Patient Office Visit  Subjective:      CC:  Chief Complaint  Patient presents with   Medical Management of Chronic Issues    HPI: Janice Bush is a 89 y.o. female presenting on 11/19/2023 for Medical Management of Chronic Issues  Sinus headaches: she states she has not had one of these migraines  since the last tie she saw me which was 10/16/23. When this occurs she will have vertigo and also have blurry vision during these episodes, does follow with eye doctor. She will have dizziness as well when this occurs. She was recommended to start allegra, but she is waiting for the 'right time' as she knows she sometimes has side effects to antihistamines. She used to take benadryl in the past but stayed up all night and couldn't go to sleep so she stopped taking   Vertigo also has not happened since her last visit here.   Afib: following with cardiology doing well. Taking eliquis  2.5 mg twice daily, digoxin  and zetia .   Urinary incontinence: she did see urology, and she did start physical therapy with [facility] to work on pelvic floor therapy which has been slightly helpful. She does wake up twice in the night to pee and pees throughout the day about every two hours. Long trips she will have to wear depends for protection. She will often wet herself right before getting to the toilet without ability to control the release     Social history:  Relevant past medical, surgical, family and social history reviewed and updated as indicated. Interim medical history since our last visit reviewed.  Allergies and medications reviewed and updated.  DATA REVIEWED: CHART IN EPIC     ROS: Negative unless specifically indicated above in HPI.    Current Outpatient Medications:    cycloSPORINE (RESTASIS) 0.05 % ophthalmic emulsion, , Disp: , Rfl:    fexofenadine (ALLEGRA ALLERGY) 60 MG tablet, Take 1 tablet (60 mg total) by mouth daily., Disp: 30 tablet, Rfl: 0   Accu-Chek  Softclix Lancets lancets, USE TO TEST 4 TIMES DAILY AS DIRECTED., Disp: 100 each, Rfl: 3   amLODipine (NORVASC) 5 MG tablet, TAKE 1 TABLET BY MOUTH DAILY, Disp: 90 tablet, Rfl: 1   apixaban  (ELIQUIS ) 2.5 MG TABS tablet, TAKE ONE TABLET (2.5 MG) BY MOUTH TWICE DAILY, Disp: 180 tablet, Rfl: 1   ascorbic acid (VITAMIN C) 500 MG tablet, Take 500 mg by mouth daily., Disp: , Rfl:    blood glucose meter kit and supplies, Dispense based on patient and insurance preference. Use up to four times daily as directed, Disp: 1 each, Rfl: 0   Blood Glucose Monitoring Suppl DEVI, 1 each by Does not apply route in the morning, at noon, and at bedtime. May substitute to any manufacturer covered by patient's insurance., Disp: 1 each, Rfl: 0   brimonidine (ALPHAGAN) 0.2 % ophthalmic solution, Place 1 drop into both eyes in the morning and at bedtime., Disp: , Rfl:    Cholecalciferol (VITAMIN D3 PO), Take 1 tablet by mouth daily., Disp: , Rfl:    clobetasol  (TEMOVATE ) 0.05 % external solution, APPLY TO AFFECTED AREAS TWICE DAILY AS DIRECTED, Disp: 50 mL, Rfl: 2   digoxin  (LANOXIN ) 0.125 MG tablet, Take 1 tablet (125 mcg total) by mouth daily., Disp: 90 tablet, Rfl: 3   dorzolamide-timolol (COSOPT) 2-0.5 % ophthalmic solution, 1 drop 2 (two) times daily., Disp: , Rfl:    ezetimibe  (ZETIA ) 10 MG tablet, Take 1 tablet (10 mg total)  by mouth daily., Disp: 90 tablet, Rfl: 3   gabapentin  (NEURONTIN ) 300 MG capsule, TAKE 1 CAPSULE BY MOUTH EVERY DAY, Disp: 90 capsule, Rfl: 3   latanoprost (XALATAN) 0.005 % ophthalmic solution, Place 1 drop into both eyes at bedtime., Disp: , Rfl:    LORazepam  (ATIVAN ) 0.5 MG tablet, TAKE ONE TABLET BY MOUTH AT BEDTIME AS NEEDED FOR SLEEP OR ANXIETY, Disp: 30 tablet, Rfl: 2   losartan (COZAAR) 25 MG tablet, Take 1 tablet (25 mg total) by mouth in the morning and at bedtime., Disp: , Rfl:    metFORMIN  (GLUCOPHAGE -XR) 500 MG 24 hr tablet, Take 1 tablet (500 mg total) by mouth 2 (two) times daily  with a meal., Disp: 180 tablet, Rfl: 3   Multiple Vitamins-Minerals (PRESERVISION AREDS 2) CAPS, Take 1 capsule by mouth daily., Disp: , Rfl:       Objective:    BP 126/74   Pulse 88   Resp 16   Ht 5' 5 (1.651 m)   Wt 122 lb (55.3 kg)   SpO2 98%   BMI 20.30 kg/m   Wt Readings from Last 3 Encounters:  11/19/23 122 lb (55.3 kg)  10/16/23 124 lb (56.2 kg)  07/23/23 127 lb (57.6 kg)    Physical Exam Constitutional:      General: She is not in acute distress.    Appearance: Normal appearance. She is normal weight. She is not ill-appearing, toxic-appearing or diaphoretic.  HENT:     Head: Normocephalic.     Right Ear: Tympanic membrane normal.     Left Ear: Tympanic membrane normal.     Nose: Nose normal.     Mouth/Throat:     Mouth: Mucous membranes are dry.     Pharynx: No oropharyngeal exudate or posterior oropharyngeal erythema.   Eyes:     Extraocular Movements: Extraocular movements intact.     Pupils: Pupils are equal, round, and reactive to light.    Cardiovascular:     Rate and Rhythm: Normal rate and regular rhythm.     Pulses: Normal pulses.     Heart sounds: Normal heart sounds.  Pulmonary:     Effort: Pulmonary effort is normal.   Musculoskeletal:     Cervical back: Normal range of motion.   Neurological:     General: No focal deficit present.     Mental Status: She is alert and oriented to person, place, and time. Mental status is at baseline.   Psychiatric:        Mood and Affect: Mood normal.        Behavior: Behavior normal.        Thought Content: Thought content normal.        Judgment: Judgment normal.           Assessment & Plan:  Seasonal allergic rhinitis due to pollen Assessment & Plan: Advised can take allegra if needed safer option with glaucoma.  Advised to use 60 mg if needed RX sent.  Advised to stay off unless allergic rhinitis/headache behind the eyes occurs.    Orders: -     Fexofenadine HCl; Take 1 tablet (60 mg  total) by mouth daily.  Dispense: 30 tablet; Refill: 0  Nocturnal enuresis Assessment & Plan: Referral to urologist  Newer worsening onset  Some slight improvement with pelvic floor therapy but not completely.    Orders: -     Ambulatory referral to Urology  Urge incontinence of urine -     Ambulatory referral to  Urology     Return in about 6 months (around 05/21/2024) for f/u CPE.  Ginger Patrick, MSN, APRN, FNP-C [facility]

## 2023-11-30 ENCOUNTER — Encounter (INDEPENDENT_AMBULATORY_CARE_PROVIDER_SITE_OTHER): Payer: Self-pay | Admitting: Family

## 2023-11-30 DIAGNOSIS — Z79899 Other long term (current) drug therapy: Secondary | ICD-10-CM

## 2023-11-30 DIAGNOSIS — G4700 Insomnia, unspecified: Secondary | ICD-10-CM | POA: Diagnosis not present

## 2023-12-02 ENCOUNTER — Other Ambulatory Visit: Payer: Self-pay | Admitting: Family

## 2023-12-02 DIAGNOSIS — F5101 Primary insomnia: Secondary | ICD-10-CM

## 2023-12-03 NOTE — Telephone Encounter (Signed)

## 2024-01-13 ENCOUNTER — Other Ambulatory Visit: Payer: Self-pay | Admitting: Family

## 2024-01-13 DIAGNOSIS — E119 Type 2 diabetes mellitus without complications: Secondary | ICD-10-CM

## 2024-01-23 DIAGNOSIS — L853 Xerosis cutis: Secondary | ICD-10-CM | POA: Diagnosis not present

## 2024-01-23 DIAGNOSIS — L219 Seborrheic dermatitis, unspecified: Secondary | ICD-10-CM | POA: Diagnosis not present

## 2024-01-23 DIAGNOSIS — L814 Other melanin hyperpigmentation: Secondary | ICD-10-CM | POA: Diagnosis not present

## 2024-01-23 DIAGNOSIS — L905 Scar conditions and fibrosis of skin: Secondary | ICD-10-CM | POA: Diagnosis not present

## 2024-01-23 DIAGNOSIS — L821 Other seborrheic keratosis: Secondary | ICD-10-CM | POA: Diagnosis not present

## 2024-01-29 ENCOUNTER — Encounter: Payer: Self-pay | Admitting: Family

## 2024-02-21 DIAGNOSIS — H04123 Dry eye syndrome of bilateral lacrimal glands: Secondary | ICD-10-CM | POA: Diagnosis not present

## 2024-02-21 DIAGNOSIS — H0288A Meibomian gland dysfunction right eye, upper and lower eyelids: Secondary | ICD-10-CM | POA: Diagnosis not present

## 2024-02-21 DIAGNOSIS — H53483 Generalized contraction of visual field, bilateral: Secondary | ICD-10-CM | POA: Diagnosis not present

## 2024-02-21 DIAGNOSIS — H11131 Conjunctival pigmentations, right eye: Secondary | ICD-10-CM | POA: Diagnosis not present

## 2024-02-21 DIAGNOSIS — H409 Unspecified glaucoma: Secondary | ICD-10-CM | POA: Diagnosis not present

## 2024-02-21 DIAGNOSIS — H01009 Unspecified blepharitis unspecified eye, unspecified eyelid: Secondary | ICD-10-CM | POA: Diagnosis not present

## 2024-02-21 DIAGNOSIS — H0288B Meibomian gland dysfunction left eye, upper and lower eyelids: Secondary | ICD-10-CM | POA: Diagnosis not present

## 2024-02-24 ENCOUNTER — Other Ambulatory Visit: Payer: Self-pay | Admitting: Family

## 2024-02-24 DIAGNOSIS — H401133 Primary open-angle glaucoma, bilateral, severe stage: Secondary | ICD-10-CM | POA: Diagnosis not present

## 2024-02-24 DIAGNOSIS — H353122 Nonexudative age-related macular degeneration, left eye, intermediate dry stage: Secondary | ICD-10-CM | POA: Diagnosis not present

## 2024-02-24 DIAGNOSIS — F5101 Primary insomnia: Secondary | ICD-10-CM

## 2024-02-24 DIAGNOSIS — M3501 Sicca syndrome with keratoconjunctivitis: Secondary | ICD-10-CM | POA: Diagnosis not present

## 2024-02-24 DIAGNOSIS — H02882 Meibomian gland dysfunction right lower eyelid: Secondary | ICD-10-CM | POA: Diagnosis not present

## 2024-03-13 ENCOUNTER — Other Ambulatory Visit: Payer: Self-pay | Admitting: *Deleted

## 2024-03-13 DIAGNOSIS — I480 Paroxysmal atrial fibrillation: Secondary | ICD-10-CM

## 2024-03-13 MED ORDER — APIXABAN 2.5 MG PO TABS: 2.5000 mg | ORAL_TABLET | Freq: Two times a day (BID) | ORAL | 1 refills | Status: AC

## 2024-03-13 NOTE — Telephone Encounter (Signed)
 Eliquis  2.5mg  refill request received. Patient is 89 years old, weight-55.3kg, Crea-0.60 on 10/16/23, Diagnosis-Afib, and last seen by Dr.Gollan on 07/23/23. Dose is appropriate based on dosing criteria. Will send in refill to requested pharmacy.

## 2024-03-16 ENCOUNTER — Ambulatory Visit: Admitting: Urology

## 2024-03-24 ENCOUNTER — Other Ambulatory Visit: Payer: Self-pay | Admitting: Family

## 2024-03-24 DIAGNOSIS — Z1231 Encounter for screening mammogram for malignant neoplasm of breast: Secondary | ICD-10-CM

## 2024-04-29 ENCOUNTER — Ambulatory Visit
Admission: RE | Admit: 2024-04-29 | Discharge: 2024-04-29 | Disposition: A | Discharge status: Home / Self Care | Source: Ambulatory Visit | Attending: Family | Admitting: Family

## 2024-04-29 DIAGNOSIS — Z1231 Encounter for screening mammogram for malignant neoplasm of breast: Secondary | ICD-10-CM | POA: Diagnosis present

## 2024-05-05 ENCOUNTER — Encounter: Payer: Self-pay | Admitting: Family

## 2024-05-05 ENCOUNTER — Ambulatory Visit: Payer: Self-pay | Admitting: Family

## 2024-05-06 MED ORDER — LOSARTAN POTASSIUM 25 MG PO TABS: 25.0000 mg | ORAL_TABLET | Freq: Two times a day (BID) | ORAL | 1 refills | Status: AC

## 2024-05-25 ENCOUNTER — Ambulatory Visit: Admitting: Family

## 2024-05-25 ENCOUNTER — Encounter: Payer: Self-pay | Admitting: Family

## 2024-05-25 VITALS — BP 165/89 | HR 82 | Temp 98.7°F | Ht 65.0 in | Wt 123.4 lb

## 2024-05-25 DIAGNOSIS — I480 Paroxysmal atrial fibrillation: Secondary | ICD-10-CM

## 2024-05-25 DIAGNOSIS — R2681 Unsteadiness on feet: Secondary | ICD-10-CM | POA: Diagnosis not present

## 2024-05-25 DIAGNOSIS — Z7984 Long term (current) use of oral hypoglycemic drugs: Secondary | ICD-10-CM

## 2024-05-25 DIAGNOSIS — E118 Type 2 diabetes mellitus with unspecified complications: Secondary | ICD-10-CM

## 2024-05-25 DIAGNOSIS — D509 Iron deficiency anemia, unspecified: Secondary | ICD-10-CM | POA: Diagnosis not present

## 2024-05-25 DIAGNOSIS — Z5181 Encounter for therapeutic drug level monitoring: Secondary | ICD-10-CM

## 2024-05-25 DIAGNOSIS — Z0000 Encounter for general adult medical examination without abnormal findings: Secondary | ICD-10-CM | POA: Diagnosis not present

## 2024-05-25 DIAGNOSIS — Z79899 Other long term (current) drug therapy: Secondary | ICD-10-CM

## 2024-05-25 DIAGNOSIS — H401134 Primary open-angle glaucoma, bilateral, indeterminate stage: Secondary | ICD-10-CM | POA: Diagnosis not present

## 2024-05-25 DIAGNOSIS — Z780 Asymptomatic menopausal state: Secondary | ICD-10-CM

## 2024-05-25 DIAGNOSIS — E782 Mixed hyperlipidemia: Secondary | ICD-10-CM | POA: Diagnosis not present

## 2024-05-25 DIAGNOSIS — I10 Essential (primary) hypertension: Secondary | ICD-10-CM | POA: Diagnosis not present

## 2024-05-25 LAB — MICROALBUMIN / CREATININE URINE RATIO
Creatinine,U: 38.1 mg/dL
Microalb Creat Ratio: 187.7 mg/g — ABNORMAL HIGH (ref 0.0–30.0)
Microalb, Ur: 7.2 mg/dL — ABNORMAL HIGH (ref 0.7–1.9)

## 2024-05-25 LAB — LIPID PANEL
Cholesterol: 162 mg/dL (ref 28–200)
HDL: 38.7 mg/dL — ABNORMAL LOW
LDL Cholesterol: 85 mg/dL (ref 10–99)
NonHDL: 123.62
Total CHOL/HDL Ratio: 4
Triglycerides: 195 mg/dL — ABNORMAL HIGH (ref 10.0–149.0)
VLDL: 39 mg/dL (ref 0.0–40.0)

## 2024-05-25 LAB — COMPREHENSIVE METABOLIC PANEL WITH GFR
ALT: 8 U/L (ref 3–35)
AST: 14 U/L (ref 5–37)
Albumin: 3.9 g/dL (ref 3.5–5.2)
Alkaline Phosphatase: 60 U/L (ref 39–117)
BUN: 12 mg/dL (ref 6–23)
CO2: 30 meq/L (ref 19–32)
Calcium: 9.2 mg/dL (ref 8.4–10.5)
Chloride: 101 meq/L (ref 96–112)
Creatinine, Ser: 0.61 mg/dL (ref 0.40–1.20)
GFR: 78.86 mL/min
Glucose, Bld: 182 mg/dL — ABNORMAL HIGH (ref 70–99)
Potassium: 3.7 meq/L (ref 3.5–5.1)
Sodium: 138 meq/L (ref 135–145)
Total Bilirubin: 0.5 mg/dL (ref 0.2–1.2)
Total Protein: 6.4 g/dL (ref 6.0–8.3)

## 2024-05-25 LAB — CBC
HCT: 36.2 % (ref 36.0–46.0)
Hemoglobin: 11.8 g/dL — ABNORMAL LOW (ref 12.0–15.0)
MCHC: 32.6 g/dL (ref 30.0–36.0)
MCV: 81.2 fl (ref 78.0–100.0)
Platelets: 291 K/uL (ref 150.0–400.0)
RBC: 4.45 Mil/uL (ref 3.87–5.11)
RDW: 15.7 % — ABNORMAL HIGH (ref 11.5–15.5)
WBC: 6 K/uL (ref 4.0–10.5)

## 2024-05-25 LAB — HEMOGLOBIN A1C: Hgb A1c MFr Bld: 6.7 % — ABNORMAL HIGH (ref 4.6–6.5)

## 2024-05-25 NOTE — Progress Notes (Signed)
 "  Subjective:  Patient ID: Janice Bush, female    DOB: [birth year]  Age: 89 y.o. MRN: [id number]  Patient Care Team: Corwin Antu, FNP as PCP - General (Family Medicine) Alger Gong, MD as Consulting Physician (Obstetrics and Gynecology) Perla Evalene PARAS, MD as Consulting Physician (Cardiology)   CC:  Chief Complaint  Patient presents with   Annual Exam    HPI Janice Bush is a 89 y.o. female who presents today for an annual physical exam. She reports consuming a general diet. Exercise class twice weekly She generally feels well. She reports sleeping well. She does not have additional problems to discuss today. She lives at [facility] in independent living.  Vision:Within last year Dental:Receives regular dental care  Mammogram: 04/29/24  Colonoscopy:> 41 y/o Bone density scarn: will get next year with mammogram   Pt is without acute concerns.   Discussed the use of AI scribe software for clinical note transcription with the patient, who gave verbal consent to proceed.  History of Present Illness Janice Bush is a 89 year old female who presents for an annual physical exam.  She has a history of atrial fibrillation and is on a reduced dose of digoxin . She is due for a digoxin  level check. Her A1c levels are borderline, and she is awaiting current lab results.  She has glaucoma with significant dry eyes, affecting her ability to drive and read. She uses multiple eye drops, including Restasis and Cosopt, but finds them irritating. She also uses Refresh gel drops and applies heat for symptom management.she follows regularly with her ophthalmologist.   She has urinary incontinence, particularly at night, managed with pads. She has decided against seeing a urologist and has not noticed any worsening symptoms.  She attends an exercise class twice a week designed for older adults and is cautious about falls, preferring to walk in hallways with railings. She  resides independently in a retirement community, [facility], and enjoys the amenities.  She sleeps adequately with the help of lorazepam , taking half a dose at bedtime and the other half if she wakes up during the night.    Advanced Directives Patient does have advanced directives. She does not have a copy in the electronic medical record.   DEPRESSION SCREENING    10/16/2023   11:15 AM 04/01/2023   10:50 AM 09/27/2022   10:42 AM 03/26/2022    3:41 PM 08/03/2021   11:46 AM  PHQ 2/9 Scores  PHQ - 2 Score 0 0 0 0 0  PHQ- 9 Score 3  3         Data saved with a previous flowsheet row definition     ROS: Negative unless specifically indicated above in HPI.   Current Medications[1]    Objective:    BP (!) 165/89   Pulse 82   Temp 98.7 F (37.1 C) (Temporal)   Ht 5' 5 (1.651 m)   Wt 123 lb 6.4 oz (56 kg)   SpO2 97%   BMI 20.53 kg/m   BP Readings from Last 3 Encounters:  05/25/24 (!) 165/89  11/19/23 126/74  10/16/23 (!) 144/78      Physical Exam Vitals reviewed.  Constitutional:      General: She is not in acute distress.    Appearance: Normal appearance. She is normal weight. She is not ill-appearing.  HENT:     Head: Normocephalic.     Right Ear: Tympanic membrane normal.     Left Ear: Tympanic membrane  normal.     Nose: Nose normal.     Mouth/Throat:     Mouth: Mucous membranes are moist.  Eyes:     Extraocular Movements: Extraocular movements intact.     Pupils: Pupils are equal, round, and reactive to light.  Cardiovascular:     Rate and Rhythm: Normal rate and regular rhythm.  Pulmonary:     Effort: Pulmonary effort is normal.     Breath sounds: Normal breath sounds.  Abdominal:     General: Abdomen is flat. Bowel sounds are normal.     Palpations: Abdomen is soft.     Tenderness: There is no guarding or rebound.  Musculoskeletal:        General: Normal range of motion.     Cervical back: Normal range of motion.  Skin:    General: Skin  is warm.     Capillary Refill: Capillary refill takes less than 2 seconds.  Neurological:     General: No focal deficit present.     Mental Status: She is alert.  Psychiatric:        Mood and Affect: Mood normal.        Behavior: Behavior normal.        Thought Content: Thought content normal.        Judgment: Judgment normal.       Results Labs CBC: Within normal limits      Assessment & Plan:   Assessment and Plan Assessment & Plan Adult Wellness Visit Annual wellness visit conducted. Discussed preventative care and screenings. Bone density scan not performed in years; she prefers to skip it. Colonoscopy not needed. Discussed importance of calcium, vitamin D, and weight-bearing exercises for bone health. - Ordered bone density scan to be scheduled with mammogram next year - Continue calcium and vitamin D supplementation - Encouraged gentle weight-bearing exercises -Patient Counseling(The following topics were reviewed):  Preventative care handout given to pt  Health maintenance and immunizations reviewed. Please refer to Health maintenance section. Pt advised on safe sex, wearing seatbelts in car, and proper nutrition labwork ordered today for annual Dental health: Discussed importance of regular tooth brushing, flossing, and dental visits.   Paroxysmal atrial fibrillation Chronic atrial fibrillation with well-managed heart rate and rhythm. Cardiologist reports stable condition. - Continue current management and follow-up with cardiologist  Type 2 diabetes mellitus with complications Type 2 diabetes with A1c at 7.0. Metformin  helps maintain regular bowel movements. - Ordered A1c test - Continue metformin   Essential hypertension Blood pressure management adjusted due to dizziness. Amlodipine discontinued due to low blood pressure. Current regimen includes losartan  twice daily. Blood pressure monitoring at home advised. - Monitor blood pressure at home once daily and  report average in one week - Continue losartan  twice daily  Primary open-angle glaucoma, bilateral Glaucoma is well-managed but progressing slowly. Dry eye symptoms exacerbate condition. - Continue current glaucoma management  Dry eye syndrome, bilateral Chronic dry eye causing significant discomfort and affecting daily activities. Current treatment with Restasis and other eye drops is not effective. She is using multiple eye drops and heat therapy. - Continue current eye drop regimen and heat therapy - Consider alternative eye drops if current treatment remains ineffective  Mixed hyperlipidemia Cholesterol levels to be monitored. - Ordered cholesterol test  Unsteady gait Increased unsteadiness noted. She is cautious about falls and uses rails at home. Exercise class attended twice a week. - Encouraged use of rails and caution to prevent falls - Continue exercise class twice a week  Urinary incontinence Intermittent urinary incontinence managed with pads. She has not pursued urology referral. - Continue current management with pads         Follow-up: Return in about 6 months (around 11/22/2024) for f/u diabetes.   Ginger Patrick, FNP      [1]  Current Outpatient Medications:    Accu-Chek Softclix Lancets lancets, USE TO TEST 4 TIMES DAILY AS DIRECTED., Disp: 100 each, Rfl: 3   apixaban  (ELIQUIS ) 2.5 MG TABS tablet, Take 1 tablet (2.5 mg total) by mouth 2 (two) times daily., Disp: 180 tablet, Rfl: 1   ascorbic acid (VITAMIN C) 500 MG tablet, Take 500 mg by mouth daily., Disp: , Rfl:    blood glucose meter kit and supplies, Dispense based on patient and insurance preference. Use up to four times daily as directed, Disp: 1 each, Rfl: 0   Blood Glucose Monitoring Suppl DEVI, 1 each by Does not apply route in the morning, at noon, and at bedtime. May substitute to any manufacturer covered by patient's insurance., Disp: 1 each, Rfl: 0   brimonidine (ALPHAGAN) 0.2 % ophthalmic  solution, Place 1 drop into both eyes in the morning and at bedtime., Disp: , Rfl:    Cholecalciferol (VITAMIN D3 PO), Take 1 tablet by mouth daily., Disp: , Rfl:    clobetasol  (TEMOVATE ) 0.05 % external solution, APPLY TO AFFECTED AREAS TWICE DAILY AS DIRECTED, Disp: 50 mL, Rfl: 2   cycloSPORINE (RESTASIS) 0.05 % ophthalmic emulsion, , Disp: , Rfl:    digoxin  (LANOXIN ) 0.125 MG tablet, Take 1 tablet (125 mcg total) by mouth daily., Disp: 90 tablet, Rfl: 3   dorzolamide-timolol (COSOPT) 2-0.5 % ophthalmic solution, 1 drop 2 (two) times daily., Disp: , Rfl:    ezetimibe  (ZETIA ) 10 MG tablet, Take 1 tablet (10 mg total) by mouth daily., Disp: 90 tablet, Rfl: 3   fexofenadine  (ALLEGRA  ALLERGY) 60 MG tablet, Take 1 tablet (60 mg total) by mouth daily., Disp: 30 tablet, Rfl: 0   gabapentin  (NEURONTIN ) 300 MG capsule, TAKE 1 CAPSULE BY MOUTH EVERY DAY, Disp: 90 capsule, Rfl: 3   glucose blood (ACCU-CHEK GUIDE TEST) test strip, USE TO TEST EVERY MORNING, AT NOON AND AT BEDTIME AS DIRECTED., Disp: 100 each, Rfl: 2   latanoprost (XALATAN) 0.005 % ophthalmic solution, Place 1 drop into both eyes at bedtime., Disp: , Rfl:    LORazepam  (ATIVAN ) 0.5 MG tablet, TAKE ONE TABLET BY MOUTH AT BEDTIME AS NEEDED FOR SLEEP OR ANXIETY, Disp: 30 tablet, Rfl: 3   losartan  (COZAAR ) 25 MG tablet, Take 1 tablet (25 mg total) by mouth in the morning and at bedtime., Disp: 180 tablet, Rfl: 1   metFORMIN  (GLUCOPHAGE -XR) 500 MG 24 hr tablet, Take 1 tablet (500 mg total) by mouth 2 (two) times daily with a meal., Disp: 180 tablet, Rfl: 3   Multiple Vitamins-Minerals (PRESERVISION AREDS 2) CAPS, Take 1 capsule by mouth daily., Disp: , Rfl:   "

## 2024-05-26 ENCOUNTER — Ambulatory Visit (INDEPENDENT_AMBULATORY_CARE_PROVIDER_SITE_OTHER)

## 2024-05-26 DIAGNOSIS — D509 Iron deficiency anemia, unspecified: Secondary | ICD-10-CM

## 2024-05-26 LAB — IBC + FERRITIN
Ferritin: 11.7 ng/mL (ref 10.0–291.0)
Iron: 66 ug/dL (ref 42–145)
Saturation Ratios: 15.6 % — ABNORMAL LOW (ref 20.0–50.0)
TIBC: 422.8 ug/dL (ref 250.0–450.0)
Transferrin: 302 mg/dL (ref 212.0–360.0)

## 2024-05-26 LAB — DIGOXIN LEVEL: Digoxin Level: 0.8 ug/L (ref 0.8–2.0)

## 2024-05-27 ENCOUNTER — Ambulatory Visit: Payer: Self-pay | Admitting: Family

## 2024-06-12 ENCOUNTER — Ambulatory Visit

## 2024-06-15 ENCOUNTER — Telehealth: Payer: Self-pay | Admitting: Family

## 2024-06-15 NOTE — Telephone Encounter (Signed)
 Copied from CRM #[id number]. Topic: General - Other >> Jun 12, 2024  4:09 PM Zebedee SAUNDERS wrote: Reason for CRM: Pt stated no one called [phone] her for pt's AWV. Please call pt to schedule AWV.

## 2024-09-01 ENCOUNTER — Ambulatory Visit: Admitting: Cardiovascular Disease

## 2025-05-27 ENCOUNTER — Ambulatory Visit

## 2025-05-27 ENCOUNTER — Encounter: Admitting: Family
